# Patient Record
Sex: Female | Born: 1960 | Race: Black or African American | Hispanic: No | Marital: Married | Smoking: Former smoker
Health system: Southern US, Community
[De-identification: ages and names within clinical notes are randomized; demographics above are authoritative.]

## PROBLEM LIST (undated history)

## (undated) DIAGNOSIS — R011 Cardiac murmur, unspecified: Secondary | ICD-10-CM

## (undated) DIAGNOSIS — R9430 Abnormal result of cardiovascular function study, unspecified: Secondary | ICD-10-CM

## (undated) DIAGNOSIS — Z5189 Encounter for other specified aftercare: Secondary | ICD-10-CM

## (undated) DIAGNOSIS — IMO0002 Reserved for concepts with insufficient information to code with codable children: Secondary | ICD-10-CM

## (undated) DIAGNOSIS — T7840XA Allergy, unspecified, initial encounter: Secondary | ICD-10-CM

## (undated) DIAGNOSIS — D573 Sickle-cell trait: Secondary | ICD-10-CM

## (undated) DIAGNOSIS — H269 Unspecified cataract: Secondary | ICD-10-CM

## (undated) HISTORY — PX: TUBAL LIGATION: SHX77

## (undated) HISTORY — DX: Cardiac murmur, unspecified: R01.1

## (undated) HISTORY — DX: Allergy, unspecified, initial encounter: T78.40XA

## (undated) HISTORY — DX: Sickle-cell trait: D57.3

## (undated) HISTORY — DX: Abnormal result of cardiovascular function study, unspecified: R94.30

## (undated) HISTORY — DX: Encounter for other specified aftercare: Z51.89

## (undated) HISTORY — PX: CERCLAGE REMOVAL: SUR1451

## (undated) HISTORY — DX: Unspecified cataract: H26.9

## (undated) HISTORY — DX: Reserved for concepts with insufficient information to code with codable children: IMO0002

---

## 1998-07-09 ENCOUNTER — Emergency Department (HOSPITAL_COMMUNITY): Admission: EM | Admit: 1998-07-09 | Discharge: 1998-07-09 | Payer: Self-pay | Admitting: Emergency Medicine

## 2005-11-21 ENCOUNTER — Encounter: Admission: RE | Admit: 2005-11-21 | Discharge: 2005-11-21 | Payer: Self-pay | Admitting: Family Medicine

## 2005-11-23 ENCOUNTER — Ambulatory Visit: Payer: Self-pay | Admitting: Hematology & Oncology

## 2005-11-24 LAB — CBC & DIFF AND RETIC
Basophils Absolute: 0 10*3/uL (ref 0.0–0.1)
Eosinophils Absolute: 0.1 10*3/uL (ref 0.0–0.5)
HGB: 10.9 g/dL — ABNORMAL LOW (ref 11.6–15.9)
MCHC: 33.9 g/dL (ref 32.0–36.0)
MONO%: 9.5 % (ref 0.0–13.0)
NEUT#: 1.9 10*3/uL (ref 1.5–6.5)
Platelets: 315 10*3/uL (ref 145–400)
RDW: 12.4 % (ref 11.3–14.5)
RETIC #: 72 10*3/uL (ref 19.7–115.1)
WBC: 3.7 10*3/uL — ABNORMAL LOW (ref 3.9–10.0)
lymph#: 1.4 10*3/uL (ref 0.9–3.3)

## 2005-11-24 LAB — CHCC SMEAR

## 2005-11-28 LAB — PROTEIN ELECTROPHORESIS, SERUM
Albumin ELP: 56.8 % (ref 55.8–66.1)
Alpha-1-Globulin: 4.5 % (ref 2.9–4.9)
Alpha-2-Globulin: 8.4 % (ref 7.1–11.8)
Beta 2: 10.9 % — ABNORMAL HIGH (ref 3.2–6.5)
Beta Globulin: 6.6 % (ref 4.7–7.2)
Total Protein, Serum Electrophoresis: 6.3 g/dL (ref 6.0–8.3)

## 2005-11-28 LAB — COMPREHENSIVE METABOLIC PANEL
ALT: 21 U/L (ref 0–40)
CO2: 24 mEq/L (ref 19–32)
Calcium: 8.3 mg/dL — ABNORMAL LOW (ref 8.4–10.5)
Chloride: 107 mEq/L (ref 96–112)
Creatinine, Ser: 0.73 mg/dL (ref 0.40–1.20)
Glucose, Bld: 96 mg/dL (ref 70–99)
Sodium: 140 mEq/L (ref 135–145)
Total Bilirubin: 0.8 mg/dL (ref 0.3–1.2)
Total Protein: 6.3 g/dL (ref 6.0–8.3)

## 2005-11-28 LAB — ANA: Anti Nuclear Antibody(ANA): NEGATIVE

## 2005-11-28 LAB — FERRITIN: Ferritin: 8 ng/mL — ABNORMAL LOW (ref 10–291)

## 2005-11-28 LAB — HEMOGLOBINOPATHY EVALUATION: Hgb A2 Quant: 3.2 % (ref 2.2–3.2)

## 2005-11-28 LAB — VITAMIN B12: Vitamin B-12: 521 pg/mL (ref 211–911)

## 2005-11-29 ENCOUNTER — Observation Stay (HOSPITAL_COMMUNITY): Admission: EM | Admit: 2005-11-29 | Discharge: 2005-11-30 | Payer: Self-pay | Admitting: Emergency Medicine

## 2005-11-30 ENCOUNTER — Encounter (INDEPENDENT_AMBULATORY_CARE_PROVIDER_SITE_OTHER): Payer: Self-pay | Admitting: Cardiology

## 2005-12-06 ENCOUNTER — Ambulatory Visit (HOSPITAL_COMMUNITY): Admission: RE | Admit: 2005-12-06 | Discharge: 2005-12-06 | Payer: Self-pay | Admitting: Hematology & Oncology

## 2006-01-23 ENCOUNTER — Ambulatory Visit: Payer: Self-pay | Admitting: Hematology & Oncology

## 2007-02-14 ENCOUNTER — Encounter: Admission: RE | Admit: 2007-02-14 | Discharge: 2007-02-14 | Payer: Self-pay | Admitting: Family Medicine

## 2008-07-02 IMAGING — CT CT UROGRAM
2 series · 16 of 42 positions shown, 19 images · non-contrast
Comparison: NONE

CLINICAL DATA: Right-sided pain.  Hematuria. 

CT UROGRAM
TECHNIQUE: Thin-section unenhanced axial images were obtained to 
provide a CT urogram to evaluate for possible urinary tract stone. 
 Scan thicknesses were 3 mm with 3-mm increments.

[Series 2: wo · axial · 0.57mm/px · z∈[+16,+352]mm · 13 of 125 slices shown, 16 images]
[im 9/125  soft-tissue]
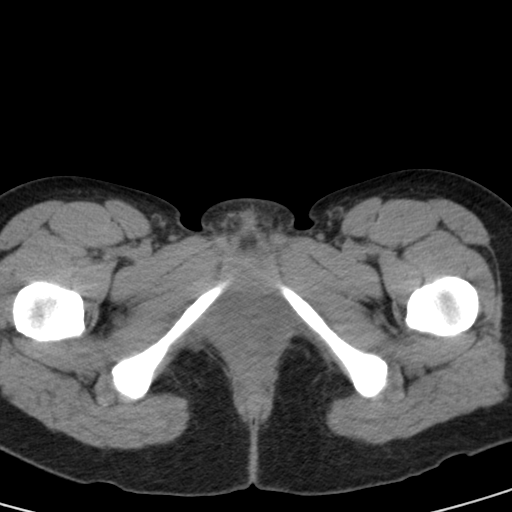
[im 9/125  bone]
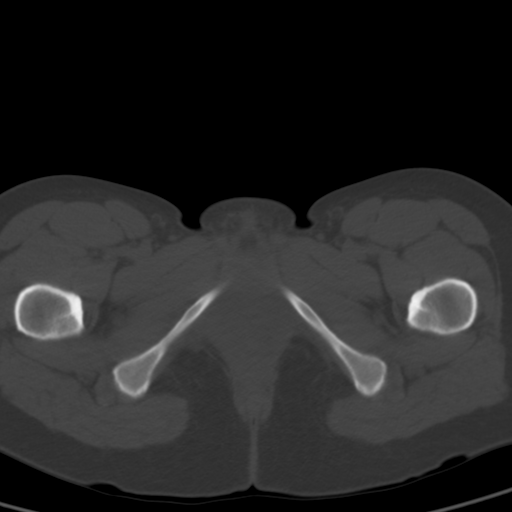
[im 21/125  soft-tissue]
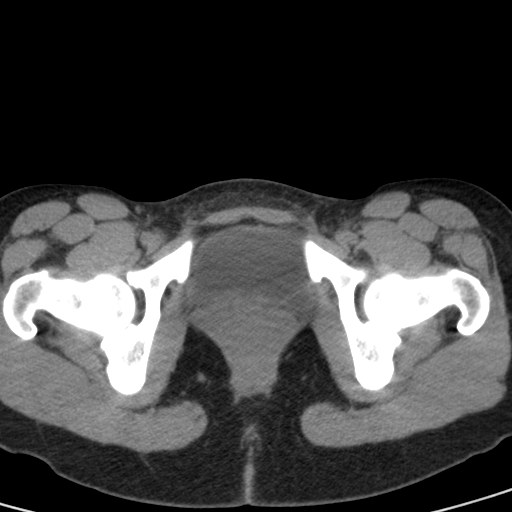
[im 33/125  soft-tissue]
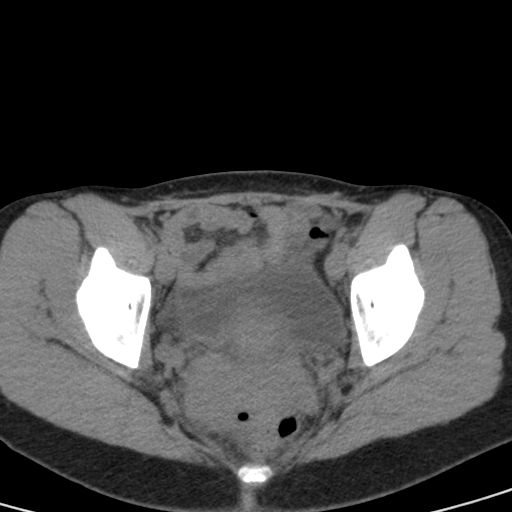
[im 45/125  soft-tissue]
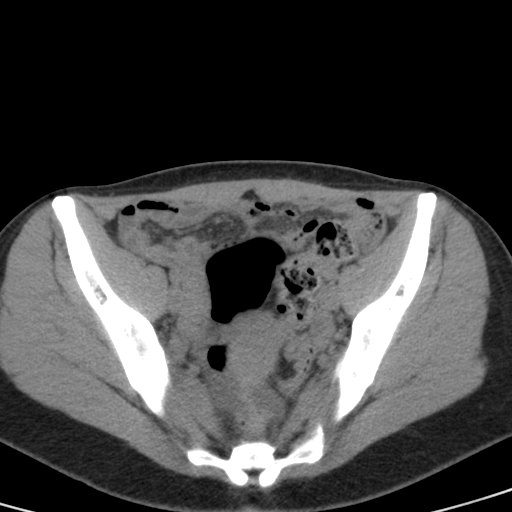
[im 57/125  soft-tissue]
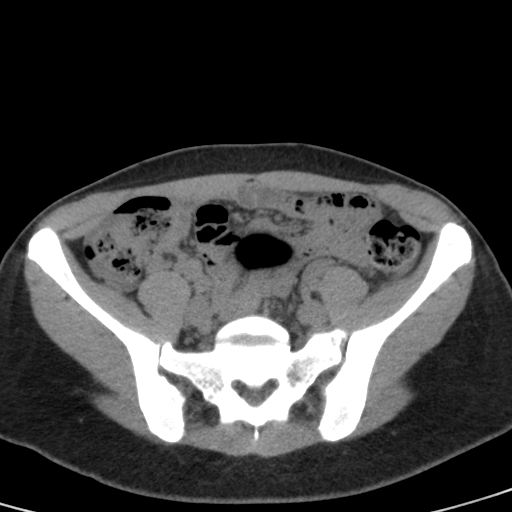
[im 69/125  soft-tissue]
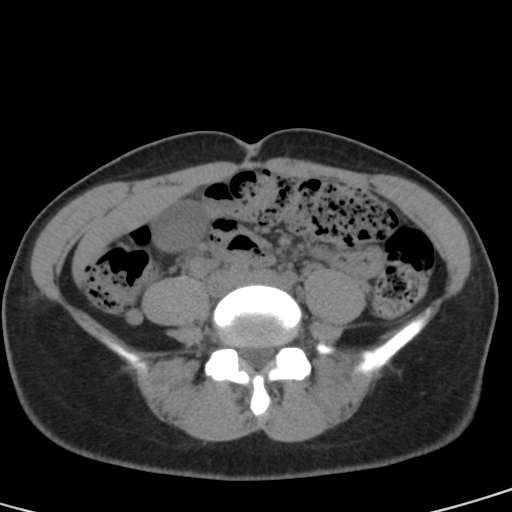
[im 81/125  soft-tissue]
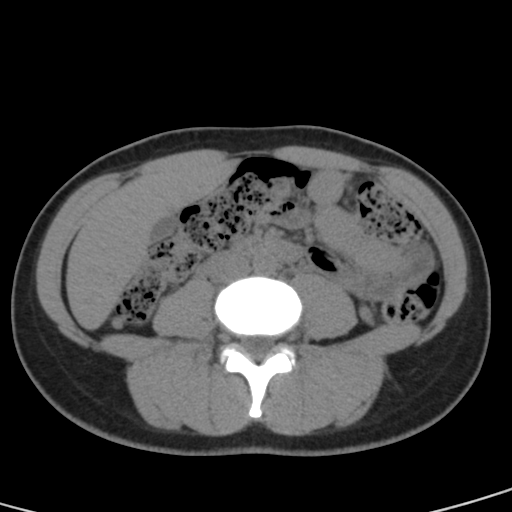
[im 93/125  soft-tissue]
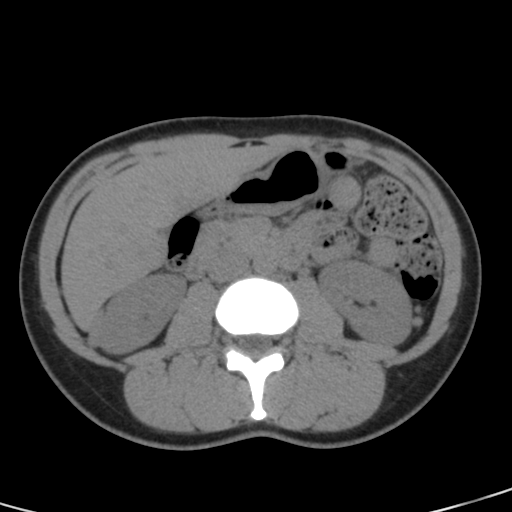
[im 105/125  soft-tissue]
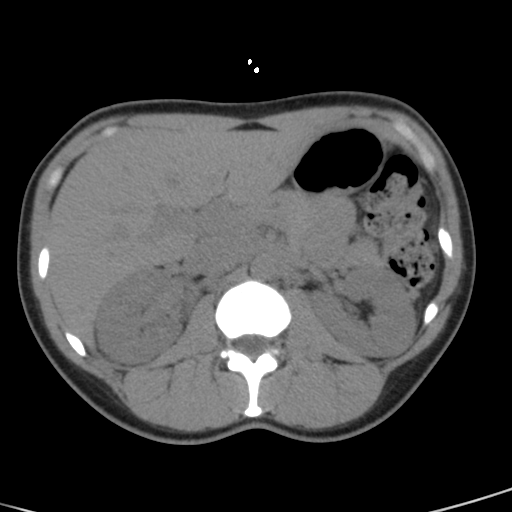
[im 105/125  bone]
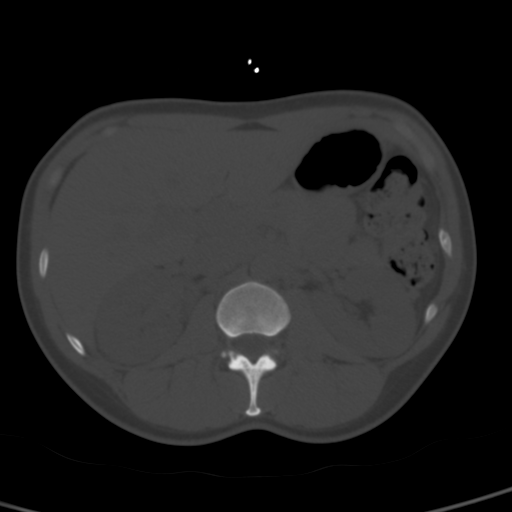
[im 109/125  lung]
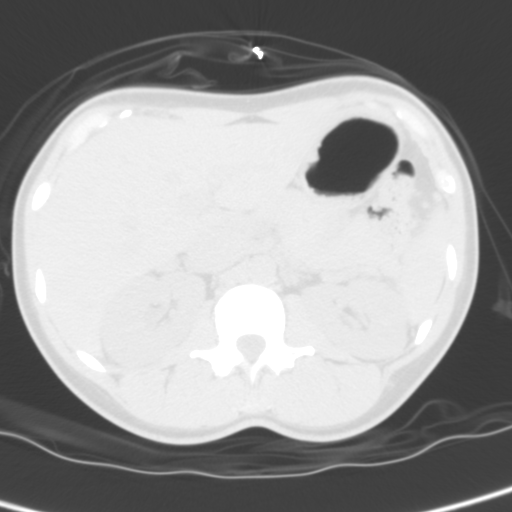
[im 113/125  lung]
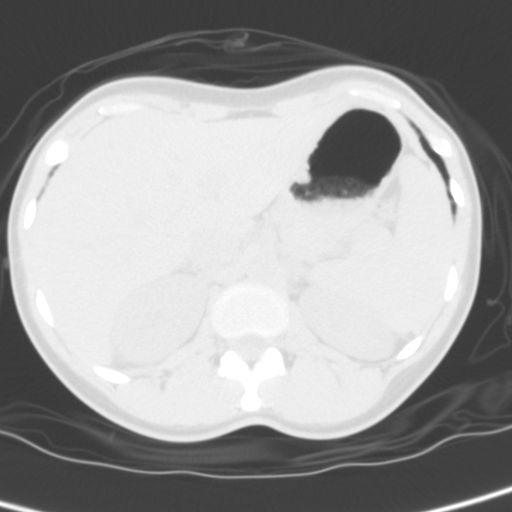
[im 117/125  soft-tissue]
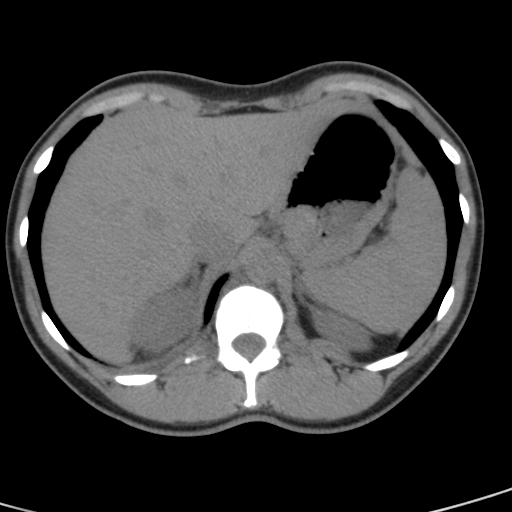
[im 117/125  lung]
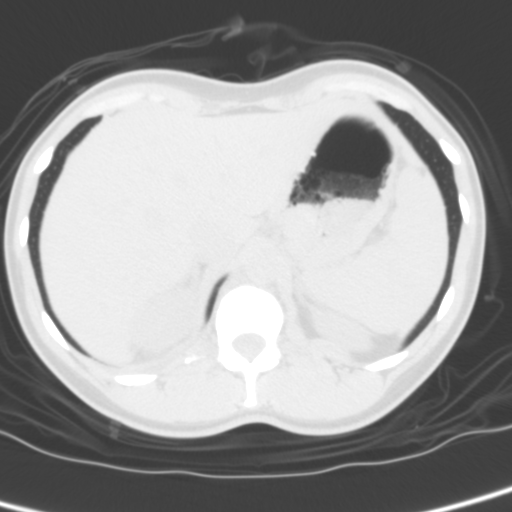
[im 121/125  lung]
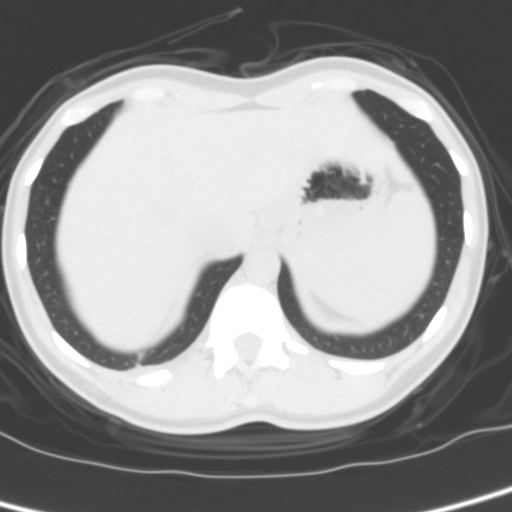

[coronals · coronal · 0.72mm/px · 3 of 72 slices shown]
[im 24/72  soft-tissue]
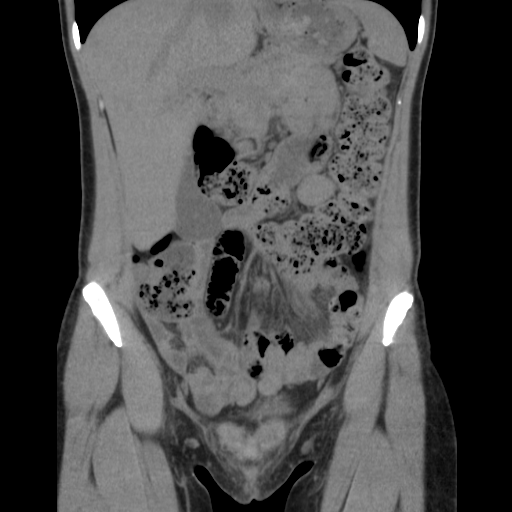
[im 32/72  soft-tissue]
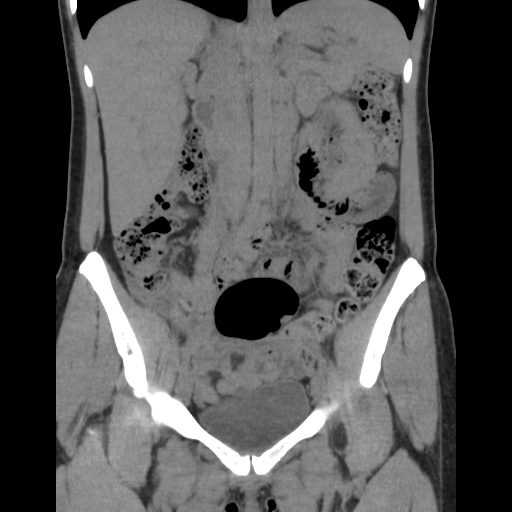
[im 40/72  soft-tissue]
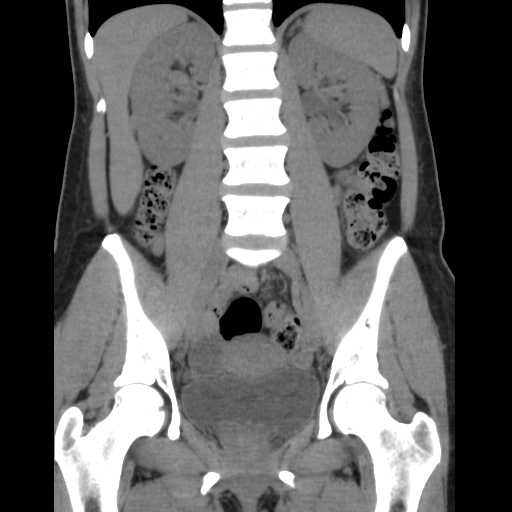

[16 of 42 positions shown; findings below may reference images not displayed]

FINDINGS: Comparison is made to 11-18-2005. No gallstones or renal 
calculi.  Liver, pancreas, and spleen are unremarkable.  The aorta 
is unremarkable. There is free fluid in the cul-de-sac.  There is 
a right ovarian cyst, likely simple-appearing with a maximum 
diameter of 2.8 cm.  There is a small left ovarian cyst 
approximately 1.0 cm in size. Free fluid in the cul-de-sac may 
indicate recent rupture of a cyst. No ureteral calculi.  No 
evidence of renal or ureteral obstruction. No evidence of 
appendicitis, diverticulitis, hernia, or bowel obstruction. No 
lung base mass, infiltrate, edema, or effusion. No lytic or 
blastic lesions.   No substantive change since the prior study 
except for free fluid in the cul-de-sac and ovarian cysts.
IMPRESSION: Bilateral ovarian cysts, larger on the right.  Free 
fluid in the cul-de-sac is either physiologic or related to recent 
rupture of a cyst.  No renal or ureteral calculi or evidence of 
renal obstruction. Blume, Adolfo Miguel electronically reviewed 
on 04/02/2007 Dict Date: 04/02/2007  Tran Date:  04/02/2007 DAS  
JLM

## 2010-07-10 ENCOUNTER — Encounter: Payer: Self-pay | Admitting: Hematology & Oncology

## 2010-11-04 NOTE — Discharge Summary (Signed)
[name], Sabrina Roman                ACCOUNT NO.:  [id number]   MEDICAL RECORD NO.:  [id number]          PATIENT TYPE:  INP   LOCATION:  3729                         FACILITY:  [facility]   PHYSICIAN:  Meade Maw, M.D.    DATE OF BIRTH:  1960-10-19   DATE OF ADMISSION:  11/29/2005  DATE OF DISCHARGE:  11/30/2005                                 DISCHARGE SUMMARY   DISCHARGE DIAGNOSES:  Chest pain, felt to be noncardiac in nature.   Sabrina Roman is a 50 year old female who evaluated by Dr. Fraser Din on November 28, 2005, in the office.  Her symptoms, at that time for chest pain, included  typical, as well as atypical features.  She says the pain has been ongoing  and chronic since the initial part of the year, but has increased in  severity and frequency over the past two weeks prior to admission.  The  patient was admitted on November 29, 2005 with worsening of symptoms.   During the patient's hospital stay, lab work showed normal cardiac  isoenzymes.  The patient underwent a stress Cardiolite that was essentially  normal.  There was a small defect within the inferior wall that was felt to  be artifact.  LV systolic function was normal without any wall motion  abnormalities.  A 2-D echo was also performed and the LV systolic function  was normal.  EF was around 60% with no wall motion abnormalities.  Aortic  valve thickness was mildly to moderately increased.   The patient was discharged home in stable condition.  She may continue her  vitamins daily.  She is to follow up with primary physician in two weeks.  A  renal low-fat diet.  Call for any further problems, questions or concerns.      Guy Franco, P.A.      Meade Maw, M.D.     LB/MEDQ  D:  12/18/2005  T:  12/18/2005  Job:  [id number]

## 2011-11-09 ENCOUNTER — Emergency Department (INDEPENDENT_AMBULATORY_CARE_PROVIDER_SITE_OTHER)
Admission: EM | Admit: 2011-11-09 | Discharge: 2011-11-09 | Disposition: A | Discharge status: Home / Self Care | Payer: Self-pay | Source: Home / Self Care | Attending: Family Medicine | Admitting: Family Medicine

## 2011-11-09 ENCOUNTER — Encounter (HOSPITAL_COMMUNITY): Payer: Self-pay | Admitting: Emergency Medicine

## 2011-11-09 DIAGNOSIS — N390 Urinary tract infection, site not specified: Secondary | ICD-10-CM

## 2011-11-09 LAB — POCT URINALYSIS DIP (DEVICE)
Bilirubin Urine: NEGATIVE
Glucose, UA: NEGATIVE mg/dL
Protein, ur: NEGATIVE mg/dL

## 2011-11-09 MED ORDER — CEPHALEXIN 500 MG PO CAPS: 500.0000 mg | ORAL_CAPSULE | Freq: Four times a day (QID) | ORAL | Status: AC

## 2011-11-09 NOTE — ED Provider Notes (Signed)
History     CSN: [id number]  Arrival date & time 11/09/11  4098   First MD Initiated Contact with Patient 11/09/11 1839      Chief Complaint  Patient presents with  . Back Pain    (Consider location/radiation/quality/duration/timing/severity/associated sxs/prior treatment) Patient is a 51 y.o. female presenting with back pain. The history is provided by the patient.  Back Pain  This is a new problem. The current episode started more than 2 days ago. The problem has been gradually worsening. The pain is associated with no known injury. The pain does not radiate. The pain is mild. Pertinent negatives include no chest pain, no fever, no weight loss, no bladder incontinence, no dysuria, no paresthesias, no paresis, no tingling and no weakness. She has tried NSAIDs for the symptoms. The treatment provided mild relief. Risk factors include menopause.    History reviewed. No pertinent past medical history.  History reviewed. No pertinent past surgical history.  History reviewed. No pertinent family history.  History  Substance Use Topics  . Smoking status: Not on file  . Smokeless tobacco: Not on file  . Alcohol Use: Not on file    OB History    Grav Para Term Preterm Abortions TAB SAB Ect Mult Living                  Review of Systems  Constitutional: Negative.  Negative for fever and weight loss.  Cardiovascular: Negative for chest pain.  Gastrointestinal: Negative for nausea, vomiting and diarrhea.  Genitourinary: Negative for bladder incontinence, dysuria, urgency, frequency and menstrual problem.  Musculoskeletal: Positive for back pain.  Neurological: Negative for tingling, weakness and paresthesias.    Allergies  Review of patient's allergies indicates no known allergies.  Home Medications   Current Outpatient Rx  Name Route Sig Dispense Refill  . CEPHALEXIN 500 MG PO CAPS Oral Take 1 capsule (500 mg total) by mouth 4 (four) times daily. Take all of medicine and  drink lots of fluids 20 capsule 0    BP 149/93  Pulse 67  Temp(Src) 98.6 F (37 C) (Oral)  Resp 16  SpO2 99%  Physical Exam  Nursing note and vitals reviewed. Constitutional: She appears well-developed and well-nourished.  Abdominal: Soft. Bowel sounds are normal. She exhibits no distension and no mass. There is no hepatosplenomegaly. There is tenderness in the suprapubic area. There is no rigidity, no rebound, no guarding and no CVA tenderness.  Skin: Skin is warm and dry.    ED Course  Procedures (including critical care time)  Labs Reviewed  POCT URINALYSIS DIP (DEVICE) - Abnormal; Notable for the following:    Hgb urine dipstick TRACE (*)    Leukocytes, UA TRACE (*) Biochemical Testing Only. Please order routine urinalysis from main lab if confirmatory testing is needed.   All other components within normal limits   No results found.   1. UTI (lower urinary tract infection)       MDM  U/a abnl        Linna Hoff, MD 11/09/11 [phone]

## 2011-11-09 NOTE — ED Notes (Signed)
Pt having lower back, flank, and pelvic pain for several days that is worse with movement. Pt tried heat, ice, and ibuprofen with minimal improvement. Pt denies dysuria or frequency.

## 2011-11-09 NOTE — Discharge Instructions (Signed)
Take all of medicine as directed, drink lots of fluids, see your doctor or return if further problems. °

## 2012-04-24 ENCOUNTER — Emergency Department (INDEPENDENT_AMBULATORY_CARE_PROVIDER_SITE_OTHER)
Admission: EM | Admit: 2012-04-24 | Discharge: 2012-04-24 | Disposition: A | Discharge status: Home / Self Care | Payer: Self-pay | Source: Home / Self Care

## 2012-04-24 ENCOUNTER — Encounter (HOSPITAL_COMMUNITY): Payer: Self-pay

## 2012-04-24 DIAGNOSIS — N390 Urinary tract infection, site not specified: Secondary | ICD-10-CM

## 2012-04-24 LAB — POCT URINALYSIS DIP (DEVICE)
Ketones, ur: NEGATIVE mg/dL
pH: 7 (ref 5.0–8.0)

## 2012-04-24 MED ORDER — CEPHALEXIN 500 MG PO CAPS: 500.0000 mg | ORAL_CAPSULE | Freq: Four times a day (QID) | ORAL | Status: DC

## 2012-04-24 NOTE — ED Notes (Signed)
C/o constant annoying pain in her flank for past 3 days, some improved w motrin; hematuria Sunday

## 2012-04-24 NOTE — ED Provider Notes (Signed)
Medical screening examination/treatment/procedure(s) were performed by non-physician practitioner and as supervising physician I was immediately available for consultation/collaboration.  Leslee Home, M.D.   Reuben Likes, MD 04/24/12 2216

## 2012-04-24 NOTE — ED Provider Notes (Signed)
History     CSN: [id number]  Arrival date & time 04/24/12  1535   None     Chief Complaint  Patient presents with  . Flank Pain    (Consider location/radiation/quality/duration/timing/severity/associated sxs/prior treatment) Patient is a 51 y.o. female presenting with flank pain. The history is provided by the patient. No language interpreter was used.  Flank Pain This is a new problem. The current episode started more than 2 days ago. The problem occurs constantly. The problem has been gradually worsening. She has tried nothing for the symptoms.  Pt complains of a fever 2 days ago.  History reviewed. No pertinent past medical history.  History reviewed. No pertinent past surgical history.  History reviewed. No pertinent family history.  History  Substance Use Topics  . Smoking status: Never Smoker   . Smokeless tobacco: Not on file  . Alcohol Use: No    OB History    Grav Para Term Preterm Abortions TAB SAB Ect Mult Living                  Review of Systems  Genitourinary: Positive for flank pain.  All other systems reviewed and are negative.    Allergies  Review of patient's allergies indicates no known allergies.  Home Medications  No current outpatient prescriptions on file.  BP 138/90  Pulse 74  Temp 98.6 F (37 C) (Oral)  Resp 18  SpO2 100%  Physical Exam  Nursing note and vitals reviewed. Constitutional: She appears well-developed and well-nourished.  HENT:  Head: Normocephalic and atraumatic.  Eyes: Conjunctivae normal and EOM are normal. Pupils are equal, round, and reactive to light.  Neck: Normal range of motion.  Cardiovascular: Normal rate and normal heart sounds.   Pulmonary/Chest: Effort normal and breath sounds normal.  Abdominal: Soft. Bowel sounds are normal.  Neurological: She is alert.  Skin: Skin is warm.    ED Course  Procedures (including critical care time)  Labs Reviewed  POCT URINALYSIS DIP (DEVICE) - Abnormal;  Notable for the following:    Hgb urine dipstick MODERATE (*)     Protein, ur 100 (*)     Leukocytes, UA LARGE (*)  Biochemical Testing Only. Please order routine urinalysis from main lab if confirmatory testing is needed.   All other components within normal limits   No results found.   No diagnosis found.    MDM  Pt given rx for keflex and urine culture ordered        Elson Areas, [place] 04/24/12 1191

## 2012-04-26 LAB — URINE CULTURE: Special Requests: NORMAL

## 2012-04-30 NOTE — ED Notes (Signed)
Urine culture: >100,000 colonies E. Coli. Pt. adequately treated with Keflex. Sabrina Roman M 04/30/2012  

## 2014-06-13 ENCOUNTER — Encounter (HOSPITAL_BASED_OUTPATIENT_CLINIC_OR_DEPARTMENT_OTHER): Payer: Self-pay | Admitting: Emergency Medicine

## 2014-06-13 ENCOUNTER — Emergency Department (HOSPITAL_BASED_OUTPATIENT_CLINIC_OR_DEPARTMENT_OTHER)
Admission: EM | Admit: 2014-06-13 | Discharge: 2014-06-13 | Disposition: A | Discharge status: Home / Self Care | Payer: Medicaid Other | Attending: Emergency Medicine | Admitting: Emergency Medicine

## 2014-06-13 ENCOUNTER — Emergency Department (HOSPITAL_BASED_OUTPATIENT_CLINIC_OR_DEPARTMENT_OTHER): Payer: Medicaid Other

## 2014-06-13 DIAGNOSIS — R079 Chest pain, unspecified: Secondary | ICD-10-CM | POA: Diagnosis present

## 2014-06-13 DIAGNOSIS — R0789 Other chest pain: Secondary | ICD-10-CM | POA: Diagnosis not present

## 2014-06-13 DIAGNOSIS — R5383 Other fatigue: Secondary | ICD-10-CM | POA: Insufficient documentation

## 2014-06-13 DIAGNOSIS — R52 Pain, unspecified: Secondary | ICD-10-CM

## 2014-06-13 DIAGNOSIS — M549 Dorsalgia, unspecified: Secondary | ICD-10-CM | POA: Diagnosis not present

## 2014-06-13 DIAGNOSIS — Z792 Long term (current) use of antibiotics: Secondary | ICD-10-CM | POA: Insufficient documentation

## 2014-06-13 DIAGNOSIS — M542 Cervicalgia: Secondary | ICD-10-CM | POA: Insufficient documentation

## 2014-06-13 LAB — BASIC METABOLIC PANEL
Anion gap: 6 (ref 5–15)
BUN: 14 mg/dL (ref 6–23)
CALCIUM: 9 mg/dL (ref 8.4–10.5)
CO2: 27 mmol/L (ref 19–32)
Chloride: 107 mEq/L (ref 96–112)
Creatinine, Ser: 0.74 mg/dL (ref 0.50–1.10)
GFR calc Af Amer: >90 mL/min (ref >90)
GFR calc non Af Amer: >90 mL/min (ref >90)
GLUCOSE: 96 mg/dL (ref 70–99)
Potassium: 3.9 mmol/L (ref 3.5–5.1)
Sodium: 140 mmol/L (ref 135–145)

## 2014-06-13 LAB — CBC
HCT: 35.9 % — ABNORMAL LOW (ref 36.0–46.0)
Hemoglobin: 12.3 g/dL (ref 12.0–15.0)
MCH: 30.9 pg (ref 26.0–34.0)
MCHC: 34.3 g/dL (ref 30.0–36.0)
MCV: 90.2 fL (ref 78.0–100.0)
Platelets: 188 10*3/uL (ref 150–400)
RBC: 3.98 MIL/uL (ref 3.87–5.11)
RDW: 12.9 % (ref 11.5–15.5)
WBC: 3.1 10*3/uL — ABNORMAL LOW (ref 4.0–10.5)

## 2014-06-13 LAB — TROPONIN I: Troponin I: <0.03 ng/mL (ref <0.031)

## 2014-06-13 MED ORDER — KETOROLAC TROMETHAMINE 30 MG/ML IJ SOLN
30.0000 mg | Freq: Once | INTRAMUSCULAR | Status: AC
  Administered 2014-06-13: 30 mg via INTRAVENOUS
  Filled 2014-06-13: qty 1

## 2014-06-13 NOTE — ED Provider Notes (Signed)
CSN: [id number]     Arrival date & time 06/13/14  1623 History  This chart was scribed for Geoffery Lyonsouglas Hazleigh Mccleave, MD by Modena JanskyAlbert Thayil, ED Scribe. This patient was seen in room MH08/MH08 and the patient's care was started at 6:05 PM.   Chief Complaint  Patient presents with  . Chest Pain  . Shortness of Breath  . Neck Pain   Patient is a 53 y.o. female presenting with chest pain, shortness of breath, and neck pain. The history is provided by the patient. No language interpreter was used.  Chest Pain Pain radiates to:  Does not radiate Pain severity:  Moderate Onset quality:  Gradual Duration:  3 months Progression:  Worsening Chronicity:  New Relieved by:  None tried Worsened by:  Exertion Ineffective treatments:  None tried Associated symptoms: back pain, fatigue, palpitations and shortness of breath   Associated symptoms: no cough, no fever and not vomiting   Shortness of Breath Associated symptoms: chest pain and neck pain   Associated symptoms: no cough, no fever and no vomiting   Neck Pain Associated symptoms: chest pain   Associated symptoms: no fever    HPI Comments: Sabrina Saversicole C Roman is a 53 y.o. female who presents to the Emergency Department complaining of constant moderate chest pain that has been going on for a few months. She reports that she has been having worsening pain, along with SOB, neck pain, back pain, heart palpitations, and fatigue. She states that yesterday she had an episode of pain yesterday when she was going for a short walk. She reports that doing activities exacerbates the chest pain. She denies any diarrhea, emesis, blood in stool, cough, or fever.   History reviewed. No pertinent past medical history. History reviewed. No pertinent past surgical history. No family history on file. History  Substance Use Topics  . Smoking status: Never Smoker   . Smokeless tobacco: Not on file  . Alcohol Use: No   OB History    No data available     Review of Systems   Constitutional: Positive for fatigue. Negative for fever.  Respiratory: Positive for shortness of breath. Negative for cough.   Cardiovascular: Positive for chest pain and palpitations.  Gastrointestinal: Negative for vomiting, diarrhea and anal bleeding.  Musculoskeletal: Positive for back pain and neck pain.  All other systems reviewed and are negative.   Allergies  Review of patient's allergies indicates no known allergies.  Home Medications   Prior to Admission medications   Medication Sig Start Date End Date Taking? Authorizing Provider  cephALEXin (KEFLEX) 500 MG capsule Take 1 capsule (500 mg total) by mouth 4 (four) times daily. 04/24/12   Elson AreasLeslie K Sofia, PA-C   BP 143/73 mmHg  Pulse 66  Temp(Src) 98.4 F (36.9 C) (Oral)  Resp 20  Ht 5\' 5"  (1.651 m)  Wt 125 lb (56.7 kg)  BMI 20.80 kg/m2  SpO2 100% Physical Exam  Constitutional: She is oriented to person, place, and time. She appears well-developed and well-nourished. No distress.  HENT:  Head: Normocephalic and atraumatic.  Neck: Neck supple. No tracheal deviation present.  Cardiovascular: Normal rate, regular rhythm and normal heart sounds.  Exam reveals no gallop and no friction rub.   No murmur heard. Pulmonary/Chest: Effort normal and breath sounds normal. No respiratory distress. She has no wheezes. She has no rales.  Abdominal: Soft. Bowel sounds are normal. She exhibits no distension. There is no tenderness. There is no rebound and no guarding.  Musculoskeletal: Normal range  of motion. She exhibits no edema.  Neurological: She is alert and oriented to person, place, and time.  Skin: Skin is warm and dry.  Psychiatric: She has a normal mood and affect. Her behavior is normal.  Nursing note and vitals reviewed.   ED Course  Procedures (including critical care time) DIAGNOSTIC STUDIES: Oxygen Saturation is 100% on RA, normal by my interpretation.    COORDINATION OF CARE: 6:05 PM- Pt advised of plan for  treatment and pt agrees.  Labs Review Labs Reviewed  CBC - Abnormal; Notable for the following:    WBC 3.1 (*)    HCT 35.9 (*)    All other components within normal limits  BASIC METABOLIC PANEL  TROPONIN I    Imaging Review Dg Chest 2 View  06/13/2014   CLINICAL DATA:  53 year old female with generalized chest pain, increases with movement. Initial encounter.  EXAM: CHEST  2 VIEW  COMPARISON:  Chest radiographs 11/29/2005.  FINDINGS: Chronic somewhat large lung volumes are stable. Normal cardiac size and mediastinal contours. Visualized tracheal air column is within normal limits. No pneumothorax, pulmonary edema, pleural effusion or confluent pulmonary opacity. No acute osseous abnormality identified.  IMPRESSION: No acute cardiopulmonary abnormality.   Electronically Signed   By: Augusto GambleLee  Hall M.D.   On: 06/13/2014 17:08     EKG Interpretation   Date/Time:  Saturday June 13 2014 16:28:27 EST Ventricular Rate:  77 PR Interval:  182 QRS Duration: 80 QT Interval:  370 QTC Calculation: 418 R Axis:   68 Text Interpretation:  Normal sinus rhythm Normal ECG Confirmed by DELOS   MD, Brand Siever ([id number]) on 06/13/2014 6:49:11 PM      MDM   Final diagnoses:  Pain    Patient presents with a several month history of intermittent chest discomfort. This occurs with both rest and exertion and are not associated with nausea, diaphoresis, or radiation to her arm or jaw. She has no risk factors and her symptoms are atypical for cardiac pain. Today's workup reveals a normal EKG and negative troponin. I feel as though she would be best served to follow-up with cardiology to discuss an outpatient stress test. She was provided the number the cardiology clinic and advised to call to arrange this appointment.  I personally performed the services described in this documentation, which was scribed in my presence. The recorded information has been reviewed and is accurate.       Geoffery Lyonsouglas Kailee Essman,  MD 06/14/14 [phone]

## 2014-06-13 NOTE — ED Notes (Signed)
Pt presents to ED with complaints of chest pain and shortness of breath that started yesterday while she was walking with her husband.

## 2014-06-13 NOTE — Discharge Instructions (Signed)
Ibuprofen 600 mg every 6 hours as needed for pain.  Follow-up with Cardiology to arrange a stress test. Call on Monday to arrange this appointment. The contact information has been provided in this discharge summary.  Return to the emergency department if your symptoms substantially worsen or change.   Chest Pain (Nonspecific) It is often hard to give a specific diagnosis for the cause of chest pain. There is always a chance that your pain could be related to something serious, such as a heart attack or a blood clot in the lungs. You need to follow up with your health care provider for further evaluation. CAUSES   Heartburn.  Pneumonia or bronchitis.  Anxiety or stress.  Inflammation around your heart (pericarditis) or lung (pleuritis or pleurisy).  A blood clot in the lung.  A collapsed lung (pneumothorax). It can develop suddenly on its own (spontaneous pneumothorax) or from trauma to the chest.  Shingles infection (herpes zoster virus). The chest wall is composed of bones, muscles, and cartilage. Any of these can be the source of the pain.  The bones can be bruised by injury.  The muscles or cartilage can be strained by coughing or overwork.  The cartilage can be affected by inflammation and become sore (costochondritis). DIAGNOSIS  Lab tests or other studies may be needed to find the cause of your pain. Your health care provider may have you take a test called an ambulatory electrocardiogram (ECG). An ECG records your heartbeat patterns over a 24-hour period. You may also have other tests, such as:  Transthoracic echocardiogram (TTE). During echocardiography, sound waves are used to evaluate how blood flows through your heart.  Transesophageal echocardiogram (TEE).  Cardiac monitoring. This allows your health care provider to monitor your heart rate and rhythm in real time.  Holter monitor. This is a portable device that records your heartbeat and can help diagnose heart  arrhythmias. It allows your health care provider to track your heart activity for several days, if needed.  Stress tests by exercise or by giving medicine that makes the heart beat faster. TREATMENT   Treatment depends on what may be causing your chest pain. Treatment may include:  Acid blockers for heartburn.  Anti-inflammatory medicine.  Pain medicine for inflammatory conditions.  Antibiotics if an infection is present.  You may be advised to change lifestyle habits. This includes stopping smoking and avoiding alcohol, caffeine, and chocolate.  You may be advised to keep your head raised (elevated) when sleeping. This reduces the chance of acid going backward from your stomach into your esophagus. Most of the time, nonspecific chest pain will improve within 2-3 days with rest and mild pain medicine.  HOME CARE INSTRUCTIONS   If antibiotics were prescribed, take them as directed. Finish them even if you start to feel better.  For the next few days, avoid physical activities that bring on chest pain. Continue physical activities as directed.  Do not use any tobacco products, including cigarettes, chewing tobacco, or electronic cigarettes.  Avoid drinking alcohol.  Only take medicine as directed by your health care provider.  Follow your health care provider's suggestions for further testing if your chest pain does not go away.  Keep any follow-up appointments you made. If you do not go to an appointment, you could develop lasting (chronic) problems with pain. If there is any problem keeping an appointment, call to reschedule. SEEK MEDICAL CARE IF:   Your chest pain does not go away, even after treatment.  You have  a rash with blisters on your chest.  You have a fever. SEEK IMMEDIATE MEDICAL CARE IF:   You have increased chest pain or pain that spreads to your arm, neck, jaw, back, or abdomen.  You have shortness of breath.  You have an increasing cough, or you cough up  blood.  You have severe back or abdominal pain.  You feel nauseous or vomit.  You have severe weakness.  You faint.  You have chills. This is an emergency. Do not wait to see if the pain will go away. Get medical help at once. Call your local emergency services (911 in [place] Do not drive yourself to the hospital. MAKE SURE YOU:   Understand these instructions.  Will watch your condition.  Will get help right away if you are not doing well or get worse. Document Released: 03/15/2005 Document Revised: 06/10/2013 Document Reviewed: 01/09/2008 [facility] Patient Information 2015 [place], [place]. This information is not intended to replace advice given to you by your health care provider. Make sure you discuss any questions you have with your health care provider.

## 2014-06-26 ENCOUNTER — Encounter: Payer: Self-pay | Admitting: Cardiology

## 2014-06-26 DIAGNOSIS — R0602 Shortness of breath: Secondary | ICD-10-CM | POA: Insufficient documentation

## 2014-06-26 DIAGNOSIS — R079 Chest pain, unspecified: Secondary | ICD-10-CM | POA: Insufficient documentation

## 2014-06-26 DIAGNOSIS — R9430 Abnormal result of cardiovascular function study, unspecified: Secondary | ICD-10-CM | POA: Insufficient documentation

## 2014-06-29 ENCOUNTER — Ambulatory Visit: Payer: Medicaid Other | Admitting: Cardiology

## 2014-07-03 ENCOUNTER — Other Ambulatory Visit (HOSPITAL_COMMUNITY)
Admission: RE | Admit: 2014-07-03 | Discharge: 2014-07-03 | Disposition: A | Discharge status: Home / Self Care | Payer: Medicaid Other | Source: Ambulatory Visit | Attending: Family Medicine | Admitting: Family Medicine

## 2014-07-03 ENCOUNTER — Encounter: Payer: Self-pay | Admitting: Family Medicine

## 2014-07-03 ENCOUNTER — Ambulatory Visit (INDEPENDENT_AMBULATORY_CARE_PROVIDER_SITE_OTHER): Payer: Medicaid Other | Admitting: Family Medicine

## 2014-07-03 ENCOUNTER — Other Ambulatory Visit: Payer: Self-pay | Admitting: Family Medicine

## 2014-07-03 VITALS — BP 115/68 | HR 69 | Temp 98.1°F | Resp 16 | Ht 65.0 in | Wt 130.0 lb

## 2014-07-03 DIAGNOSIS — Z01419 Encounter for gynecological examination (general) (routine) without abnormal findings: Secondary | ICD-10-CM | POA: Insufficient documentation

## 2014-07-03 DIAGNOSIS — Z23 Encounter for immunization: Secondary | ICD-10-CM | POA: Diagnosis not present

## 2014-07-03 DIAGNOSIS — Z0000 Encounter for general adult medical examination without abnormal findings: Secondary | ICD-10-CM

## 2014-07-03 LAB — CBC WITH DIFFERENTIAL/PLATELET
BASOS PCT: 0 % (ref 0–1)
Basophils Absolute: 0 10*3/uL (ref 0.0–0.1)
Eosinophils Absolute: 0.1 10*3/uL (ref 0.0–0.7)
Eosinophils Relative: 2 % (ref 0–5)
HCT: 36.5 % (ref 36.0–46.0)
Hemoglobin: 12.4 g/dL (ref 12.0–15.0)
Lymphocytes Relative: 44 % (ref 12–46)
Lymphs Abs: 1.1 10*3/uL (ref 0.7–4.0)
MCH: 30.3 pg (ref 26.0–34.0)
MCHC: 34 g/dL (ref 30.0–36.0)
MCV: 89.2 fL (ref 78.0–100.0)
MONOS PCT: 9 % (ref 3–12)
MPV: 9 fL (ref 8.6–12.4)
Monocytes Absolute: 0.2 10*3/uL (ref 0.1–1.0)
NEUTROS PCT: 45 % (ref 43–77)
Neutro Abs: 1.2 10*3/uL — ABNORMAL LOW (ref 1.7–7.7)
PLATELETS: 219 10*3/uL (ref 150–400)
RBC: 4.09 MIL/uL (ref 3.87–5.11)
RDW: 13.9 % (ref 11.5–15.5)
WBC: 2.6 10*3/uL — AB (ref 4.0–10.5)

## 2014-07-03 LAB — COMPLETE METABOLIC PANEL WITH GFR
ALBUMIN: 3.8 g/dL (ref 3.5–5.2)
ALK PHOS: 53 U/L (ref 39–117)
ALT: 28 U/L (ref 0–35)
AST: 36 U/L (ref 0–37)
BUN: 10 mg/dL (ref 6–23)
CHLORIDE: 106 meq/L (ref 96–112)
CO2: 26 mEq/L (ref 19–32)
CREATININE: 0.78 mg/dL (ref 0.50–1.10)
Calcium: 8.9 mg/dL (ref 8.4–10.5)
GFR, EST NON AFRICAN AMERICAN: 86 mL/min
GLUCOSE: 75 mg/dL (ref 70–99)
POTASSIUM: 3.8 meq/L (ref 3.5–5.3)
SODIUM: 142 meq/L (ref 135–145)
Total Bilirubin: 0.8 mg/dL (ref 0.2–1.2)
Total Protein: 6.6 g/dL (ref 6.0–8.3)

## 2014-07-03 LAB — LIPID PANEL
Cholesterol: 122 mg/dL (ref 0–200)
HDL: 50 mg/dL (ref >39)
LDL CALC: 62 mg/dL (ref 0–99)
TRIGLYCERIDES: 49 mg/dL (ref <150)
Total CHOL/HDL Ratio: 2.4 Ratio
VLDL: 10 mg/dL (ref 0–40)

## 2014-07-03 LAB — HEMOGLOBIN A1C
Hgb A1c MFr Bld: 5.5 % (ref <5.7)
Mean Plasma Glucose: 111 mg/dL (ref <117)

## 2014-07-03 LAB — IFOBT (OCCULT BLOOD): IFOBT: NEGATIVE

## 2014-07-03 LAB — TSH: TSH: 0.294 u[IU]/mL — ABNORMAL LOW (ref 0.350–4.500)

## 2014-07-03 MED ORDER — ZOSTER VACCINE LIVE 19400 UNT/0.65ML ~~LOC~~ SOLR: 0.6500 mL | Freq: Once | SUBCUTANEOUS | Status: DC

## 2014-07-03 NOTE — Progress Notes (Signed)
ANNUAL PREVENTATIVE VISIT AND CPE  Subjective:  Sabrina Roman is a 54 y.o. female who presents for Annual Wellness Visit and complete physical.  Date of last wellness visit is unknown. She states that she generally feels well.  Today her BP is115/68. She does workout. She denies chest pain, shortness of breath, dizziness.  She is not on cholesterol medication. She maintains that she has not had her cholesterol checked in years.   Names of Other Physician/Practitioners you currently use: Patient has not been followed by a primary provider in years.   Medication Review: Current Outpatient Prescriptions on File Prior to Visit  Medication Sig Dispense Refill  . cephALEXin (KEFLEX) 500 MG capsule Take 1 capsule (500 mg total) by mouth 4 (four) times daily. (Patient not taking: Reported on 07/03/2014) 40 capsule 0   No current facility-administered medications on file prior to visit.    Current Problems (verified) Patient Active Problem List   Diagnosis Date Noted  . Chest pain 06/26/2014  . SOB (shortness of breath) 06/26/2014  . Ejection fraction     Screening Tests Health Maintenance  Topic Date Due  . PAP SMEAR  07/02/1977  . TETANUS/TDAP  07/02/1978  . MAMMOGRAM  07/02/2009  . COLONOSCOPY  07/02/2009  . INFLUENZA VACCINE  09/17/2014 (Originally 01/17/2014)    There is no immunization history for the selected administration types on file for this patient.  Preventative care: Last colonoscopy: Never Last mammogram: 3 years ago Last pap smear/pelvic exam: She cannot remember last pap DEXA: Never  Prior vaccinations: TD or Tdap: Today  Influenza: Refused Pneumococcal: Refused Prevnar13:  Shingles/Zostavax: Today .    Medication List       This list is accurate as of: 07/03/14  2:37 PM.  Always use your most recent med list.               cephALEXin 500 MG capsule  Commonly known as:  KEFLEX  Take 1 capsule (500 mg total) by mouth 4 (four) times daily.         No past surgical history on file. No family history on file.  History reviewed: allergies, current medications, past family history, past medical history, past social history, past surgical history and problem list   Risk Factors: Osteoporosis/FallRisk: Tobacco History  Substance Use Topics  . Smoking status: Never Smoker   . Smokeless tobacco: Not on file  . Alcohol Use: No   She does not smoke.  Patient is not a former smoker. Are there smokers in your home (other than you)? No Alcohol Current alcohol use: none  Caffeine Current caffeine use: denies use  Exercise Current exercise: aerobics, housecleaning, walking and yard work  Nutrition/Diet Current diet: in general, a "healthy" diet    Depression Screen  Q1: Over the past two weeks, have you felt down, depressed or hopeless? No  Q2: Over the past two weeks, have you felt little interest or pleasure in doing things? No  Have you lost interest or pleasure in daily life?No  Do you often feel hopeless? No  Do you cry easily over simple problems? No  Activities of Daily Living In your present state of health, do you have any difficulty performing the following activities?:  Driving? No Managing money? No Feeding yourself? No Getting from bed to chair?No Climbing a flight of stairs? No Preparing food and eating?No Bathing or showering? No Getting dressed:No Getting to the toilet?No Using the toilet:No Moving around from place to place:No In the past  year have you fallen or had a near fall?No  Are you sexually active?  Yes  Do you have more than one partner?  No Vision Difficulties: YU Hearing Difficulties:No  Cognition  Do you feel that you have a problem with memory?No Do you often misplace items? No  Do you feel safe at home? No  Advanced directives Does patient have a Health Care Power of Attorney?No Does patient have a Living Will?No   Objective:     Blood pressure 115/68, pulse 69,  temperature 98.1 F (36.7 C), temperature source Oral, resp. rate 16, height  (1.651 m), weight 130 lb (58.968 kg), SpO2 100 %. Body mass index is 21.63 kg/(m^2).  General appearance: alert, no distress, WD/WN, female Cognitive Testing  Alert? Yes  Normal Appearance?Yes  Oriented to person? Yes  Place? Yes   Time? Yes  Recall of three objects?  Yes  Can perform simple calculations? Yes  Displays appropriate judgment?Yes  Can read the correct time from a watch face?Yes  HEENT: normocephalic, sclerae anicteric, TMs pearly, nares patent, no discharge or erythema, pharynx normal Oral cavity: MMM, no lesions Neck: supple, no lymphadenopathy, no thyromegaly, no masses Heart: RRR, normal S1, S2, no murmurs Lungs: CTA bilaterally, no wheezes, rhonchi, or rales Abdomen: +bs, soft, non tender, non distended, no masses, no hepatomegaly, no splenomegaly Musculoskeletal: nontender, no swelling, no obvious deformity Extremities: no edema, no cyanosis, no clubbing Pulses: 2+ symmetric, upper and lower extremities, normal cap refill Neurological: alert, oriented x 3, CN2-12 intact, strength normal upper extremities and lower extremities, sensation normal throughout, DTRs 2+ throughout, no cerebellar signs, gait normal Vaginal:  Minimal vaginal discharge, decrease vaginal rugae. Rectal tone present.  Breast: Noo lumps, nodules, or discharge Psychiatric: normal affect, behavior normal, pleasant   Assessment:  Patient denies any difficulties at home. No trouble with ADLs, depression or falls. No recent changes to vision or hearing. Is UTD with immunizations. Is UTD with screening. Discussed Advanced Directives, patient agrees to bring [place] copies of documents if can. Encouraged heart healthy diet, exercise as tolerated and adequate sleep. Declines flu shot. Pap smear done today.     Plan:   During the course of the visit the patient was educated and counseled about appropriate screening and  preventive services including:    Pneumococcal vaccine   Influenza vaccine  Td vaccine  Screening electrocardiogram  Bone densitometry screening  Colorectal cancer screening  Diabetes screening  Glaucoma screening  Nutrition counseling   Advanced directives: requested  Screening recommendations, referrals: Vaccinations: Please see documentation below and orders this visit.  Nutrition assessed and recommended  Colonoscopy requested Recommended yearly ophthalmology/optometry visit for glaucoma screening and checkup Recommended yearly dental visit for hygiene and checkup Advanced directives - requested  Conditions/risks identified: BMI: Discussed weight loss, diet, and increase physical activity.  Increase physical activity: AHA recommends 150 minutes of physical activity a week.  Medications reviewed Fall risk: Low   Annual physical exam  - Tdap vaccine greater than or equal to 7yo IM - Varicella-zoster vaccine subcutaneous  - EKG 12-Lead (Normal sinus rhythm on review) - Cytology - PAP [place] - IFOBT POC (occult bld, rslt in office) Negative - Urinalysis, Complete - CBC with Differential - COMPLETE METABOLIC PANEL WITH GFR - Lipid Panel - Vitamin D, 25-hydroxy - TSH -- Ambulatory referral to Gastroenterology - MM DIGITAL SCREENING BILATERAL; Future - Hemoglobin A1c      RTC: Annual Physical Examination 1 year  Massie Maroon, FNP   07/03/2014

## 2014-07-03 NOTE — Patient Instructions (Addendum)
Preventive Care for Adults A healthy lifestyle and preventive care can promote health and wellness. Preventive health guidelines for women include the following key practices.  A routine yearly physical is a good way to check with your health care provider about your health and preventive screening. It is a chance to share any concerns and updates on your health and to receive a thorough exam.  Visit your dentist for a routine exam and preventive care every 6 months. Brush your teeth twice a day and floss once a day. Good oral hygiene prevents tooth decay and gum disease.  The frequency of eye exams is based on your age, health, family medical history, use of contact lenses, and other factors. Follow your health care provider's recommendations for frequency of eye exams.  Eat a healthy diet. Foods like vegetables, fruits, whole grains, low-fat dairy products, and lean protein foods contain the nutrients you need without too many calories. Decrease your intake of foods high in solid fats, added sugars, and salt. Eat the right amount of calories for you.Get information about a proper diet from your health care provider, if necessary.  Regular physical exercise is one of the most important things you can do for your health. Most adults should get at least 150 minutes of moderate-intensity exercise (any activity that increases your heart rate and causes you to sweat) each week. In addition, most adults need muscle-strengthening exercises on 2 or more days a week.  Maintain a healthy weight. The body mass index (BMI) is a screening tool to identify possible weight problems. It provides an estimate of body fat based on height and weight. Your health care provider can find your BMI and can help you achieve or maintain a healthy weight.For adults 20 years and older:  A BMI below 18.5 is considered underweight.  A BMI of 18.5 to 24.9 is normal.  A BMI of 25 to 29.9 is considered overweight.  A BMI of  30 and above is considered obese.  Maintain normal blood lipids and cholesterol levels by exercising and minimizing your intake of saturated fat. Eat a balanced diet with plenty of fruit and vegetables. Blood tests for lipids and cholesterol should begin at age 76 and be repeated every 5 years. If your lipid or cholesterol levels are high, you are over 50, or you are at high risk for heart disease, you may need your cholesterol levels checked more frequently.Ongoing high lipid and cholesterol levels should be treated with medicines if diet and exercise are not working.  If you smoke, find out from your health care provider how to quit. If you do not use tobacco, do not start.  Lung cancer screening is recommended for adults aged 22-80 years who are at high risk for developing lung cancer because of a history of smoking. A yearly low-dose CT scan of the lungs is recommended for people who have at least a 30-pack-year history of smoking and are a current smoker or have quit within the past 15 years. A pack year of smoking is smoking an average of 1 pack of cigarettes a day for 1 year (for example: 1 pack a day for 30 years or 2 packs a day for 15 years). Yearly screening should continue until the smoker has stopped smoking for at least 15 years. Yearly screening should be stopped for people who develop a health problem that would prevent them from having lung cancer treatment.  If you are pregnant, do not drink alcohol. If you are breastfeeding,  be very cautious about drinking alcohol. If you are not pregnant and choose to drink alcohol, do not have more than 1 drink per day. One drink is considered to be 12 ounces (355 mL) of beer, 5 ounces (148 mL) of wine, or 1.5 ounces (44 mL) of liquor.  Avoid use of street drugs. Do not share needles with anyone. Ask for help if you need support or instructions about stopping the use of drugs.  High blood pressure causes heart disease and increases the risk of  stroke. Your blood pressure should be checked at least every 1 to 2 years. Ongoing high blood pressure should be treated with medicines if weight loss and exercise do not work.  If you are 3-86 years old, ask your health care provider if you should take aspirin to prevent strokes.  Diabetes screening involves taking a blood sample to check your fasting blood sugar level. This should be done once every 3 years, after age 67, if you are within normal weight and without risk factors for diabetes. Testing should be considered at a younger age or be carried out more frequently if you are overweight and have at least 1 risk factor for diabetes.  Breast cancer screening is essential preventive care for women. You should practice "breast self-awareness." This means understanding the normal appearance and feel of your breasts and may include breast self-examination. Any changes detected, no matter how small, should be reported to a health care provider. Women in their 8s and 30s should have a clinical breast exam (CBE) by a health care provider as part of a regular health exam every 1 to 3 years. After age 70, women should have a CBE every year. Starting at age 25, women should consider having a mammogram (breast X-ray test) every year. Women who have a family history of breast cancer should talk to their health care provider about genetic screening. Women at a high risk of breast cancer should talk to their health care providers about having an MRI and a mammogram every year.  Breast cancer gene (BRCA)-related cancer risk assessment is recommended for women who have family members with BRCA-related cancers. BRCA-related cancers include breast, ovarian, tubal, and peritoneal cancers. Having family members with these cancers may be associated with an increased risk for harmful changes (mutations) in the breast cancer genes BRCA1 and BRCA2. Results of the assessment will determine the need for genetic counseling and  BRCA1 and BRCA2 testing.  Routine pelvic exams to screen for cancer are no longer recommended for nonpregnant women who are considered low risk for cancer of the pelvic organs (ovaries, uterus, and vagina) and who do not have symptoms. Ask your health care provider if a screening pelvic exam is right for you.  If you have had past treatment for cervical cancer or a condition that could lead to cancer, you need Pap tests and screening for cancer for at least 20 years after your treatment. If Pap tests have been discontinued, your risk factors (such as having a new sexual partner) need to be reassessed to determine if screening should be resumed. Some women have medical problems that increase the chance of getting cervical cancer. In these cases, your health care provider may recommend more frequent screening and Pap tests.  The HPV test is an additional test that may be used for cervical cancer screening. The HPV test looks for the virus that can cause the cell changes on the cervix. The cells collected during the Pap test can be  tested for HPV. The HPV test could be used to screen women aged 30 years and older, and should be used in women of any age who have unclear Pap test results. After the age of 30, women should have HPV testing at the same frequency as a Pap test.  Colorectal cancer can be detected and often prevented. Most routine colorectal cancer screening begins at the age of 50 years and continues through age 75 years. However, your health care provider may recommend screening at an earlier age if you have risk factors for colon cancer. On a yearly basis, your health care provider may provide home test kits to check for hidden blood in the stool. Use of a small camera at the end of a tube, to directly examine the colon (sigmoidoscopy or colonoscopy), can detect the earliest forms of colorectal cancer. Talk to your health care provider about this at age 50, when routine screening begins. Direct  exam of the colon should be repeated every 5-10 years through age 75 years, unless early forms of pre-cancerous polyps or small growths are found.  People who are at an increased risk for hepatitis B should be screened for this virus. You are considered at high risk for hepatitis B if:  You were born in a country where hepatitis B occurs often. Talk with your health care provider about which countries are considered high risk.  Your parents were born in a high-risk country and you have not received a shot to protect against hepatitis B (hepatitis B vaccine).  You have HIV or AIDS.  You use needles to inject street drugs.  You live with, or have sex with, someone who has hepatitis B.  You get hemodialysis treatment.  You take certain medicines for conditions like cancer, organ transplantation, and autoimmune conditions.  Hepatitis C blood testing is recommended for all people born from 1945 through 1965 and any individual with known risks for hepatitis C.  Practice safe sex. Use condoms and avoid high-risk sexual practices to reduce the spread of sexually transmitted infections (STIs). STIs include gonorrhea, chlamydia, syphilis, trichomonas, herpes, HPV, and human immunodeficiency virus (HIV). Herpes, HIV, and HPV are viral illnesses that have no cure. They can result in disability, cancer, and death.  You should be screened for sexually transmitted illnesses (STIs) including gonorrhea and chlamydia if:  You are sexually active and are younger than 24 years.  You are older than 24 years and your health care provider tells you that you are at risk for this type of infection.  Your sexual activity has changed since you were last screened and you are at an increased risk for chlamydia or gonorrhea. Ask your health care provider if you are at risk.  If you are at risk of being infected with HIV, it is recommended that you take a prescription medicine daily to prevent HIV infection. This is  called preexposure prophylaxis (PrEP). You are considered at risk if:  You are a heterosexual woman, are sexually active, and are at increased risk for HIV infection.  You take drugs by injection.  You are sexually active with a partner who has HIV.  Talk with your health care provider about whether you are at high risk of being infected with HIV. If you choose to begin PrEP, you should first be tested for HIV. You should then be tested every 3 months for as long as you are taking PrEP.  Osteoporosis is a disease in which the bones lose minerals and strength   with aging. This can result in serious bone fractures or breaks. The risk of osteoporosis can be identified using a bone density scan. Women ages 65 years and over and women at risk for fractures or osteoporosis should discuss screening with their health care providers. Ask your health care provider whether you should take a calcium supplement or vitamin D to reduce the rate of osteoporosis.  Menopause can be associated with physical symptoms and risks. Hormone replacement therapy is available to decrease symptoms and risks. You should talk to your health care provider about whether hormone replacement therapy is right for you.  Use sunscreen. Apply sunscreen liberally and repeatedly throughout the day. You should seek shade when your shadow is shorter than you. Protect yourself by wearing long sleeves, pants, a wide-brimmed hat, and sunglasses year round, whenever you are outdoors.  Once a month, do a whole body skin exam, using a mirror to look at the skin on your back. Tell your health care provider of new moles, moles that have irregular borders, moles that are larger than a pencil eraser, or moles that have changed in shape or color.  Stay current with required vaccines (immunizations).  Influenza vaccine. All adults should be immunized every year.  Tetanus, diphtheria, and acellular pertussis (Td, Tdap) vaccine. Pregnant women should  receive 1 dose of Tdap vaccine during each pregnancy. The dose should be obtained regardless of the length of time since the last dose. Immunization is preferred during the 27th-36th week of gestation. An adult who has not previously received Tdap or who does not know her vaccine status should receive 1 dose of Tdap. This initial dose should be followed by tetanus and diphtheria toxoids (Td) booster doses every 10 years. Adults with an unknown or incomplete history of completing a 3-dose immunization series with Td-containing vaccines should begin or complete a primary immunization series including a Tdap dose. Adults should receive a Td booster every 10 years.  Varicella vaccine. An adult without evidence of immunity to varicella should receive 2 doses or a second dose if she has previously received 1 dose. Pregnant females who do not have evidence of immunity should receive the first dose after pregnancy. This first dose should be obtained before leaving the health care facility. The second dose should be obtained 4-8 weeks after the first dose.  Human papillomavirus (HPV) vaccine. Females aged 13-26 years who have not received the vaccine previously should obtain the 3-dose series. The vaccine is not recommended for use in pregnant females. However, pregnancy testing is not needed before receiving a dose. If a female is found to be pregnant after receiving a dose, no treatment is needed. In that case, the remaining doses should be delayed until after the pregnancy. Immunization is recommended for any person with an immunocompromised condition through the age of 26 years if she did not get any or all doses earlier. During the 3-dose series, the second dose should be obtained 4-8 weeks after the first dose. The third dose should be obtained 24 weeks after the first dose and 16 weeks after the second dose.  Zoster vaccine. One dose is recommended for adults aged 60 years or older unless certain conditions are  present.  Measles, mumps, and rubella (MMR) vaccine. Adults born before 1957 generally are considered immune to measles and mumps. Adults born in 1957 or later should have 1 or more doses of MMR vaccine unless there is a contraindication to the vaccine or there is laboratory evidence of immunity to   each of the three diseases. A routine second dose of MMR vaccine should be obtained at least 28 days after the first dose for students attending postsecondary schools, health care workers, or international travelers. People who received inactivated measles vaccine or an unknown type of measles vaccine during 1963-1967 should receive 2 doses of MMR vaccine. People who received inactivated mumps vaccine or an unknown type of mumps vaccine before 1979 and are at high risk for mumps infection should consider immunization with 2 doses of MMR vaccine. For females of childbearing age, rubella immunity should be determined. If there is no evidence of immunity, females who are not pregnant should be vaccinated. If there is no evidence of immunity, females who are pregnant should delay immunization until after pregnancy. Unvaccinated health care workers born before 1957 who lack laboratory evidence of measles, mumps, or rubella immunity or laboratory confirmation of disease should consider measles and mumps immunization with 2 doses of MMR vaccine or rubella immunization with 1 dose of MMR vaccine.  Pneumococcal 13-valent conjugate (PCV13) vaccine. When indicated, a person who is uncertain of her immunization history and has no record of immunization should receive the PCV13 vaccine. An adult aged 19 years or older who has certain medical conditions and has not been previously immunized should receive 1 dose of PCV13 vaccine. This PCV13 should be followed with a dose of pneumococcal polysaccharide (PPSV23) vaccine. The PPSV23 vaccine dose should be obtained at least 8 weeks after the dose of PCV13 vaccine. An adult aged 19  years or older who has certain medical conditions and previously received 1 or more doses of PPSV23 vaccine should receive 1 dose of PCV13. The PCV13 vaccine dose should be obtained 1 or more years after the last PPSV23 vaccine dose.  Pneumococcal polysaccharide (PPSV23) vaccine. When PCV13 is also indicated, PCV13 should be obtained first. All adults aged 65 years and older should be immunized. An adult younger than age 65 years who has certain medical conditions should be immunized. Any person who resides in a nursing home or long-term care facility should be immunized. An adult smoker should be immunized. People with an immunocompromised condition and certain other conditions should receive both PCV13 and PPSV23 vaccines. People with human immunodeficiency virus (HIV) infection should be immunized as soon as possible after diagnosis. Immunization during chemotherapy or radiation therapy should be avoided. Routine use of PPSV23 vaccine is not recommended for American Indians, [place] Natives, or people younger than 65 years unless there are medical conditions that require PPSV23 vaccine. When indicated, people who have unknown immunization and have no record of immunization should receive PPSV23 vaccine. One-time revaccination 5 years after the first dose of PPSV23 is recommended for people aged 19-64 years who have chronic kidney failure, nephrotic syndrome, asplenia, or immunocompromised conditions. People who received 1-2 doses of PPSV23 before age 65 years should receive another dose of PPSV23 vaccine at age 65 years or later if at least 5 years have passed since the previous dose. Doses of PPSV23 are not needed for people immunized with PPSV23 at or after age 65 years.  Meningococcal vaccine. Adults with asplenia or persistent complement component deficiencies should receive 2 doses of quadrivalent meningococcal conjugate (MenACWY-D) vaccine. The doses should be obtained at least 2 months apart.  Microbiologists working with certain meningococcal bacteria, military recruits, people at risk during an outbreak, and people who travel to or live in countries with a high rate of meningitis should be immunized. A first-year college student up through age   21 years who is living in a residence hall should receive a dose if she did not receive a dose on or after her 16th birthday. Adults who have certain high-risk conditions should receive one or more doses of vaccine.  Hepatitis A vaccine. Adults who wish to be protected from this disease, have certain high-risk conditions, work with hepatitis A-infected animals, work in hepatitis A research labs, or travel to or work in countries with a high rate of hepatitis A should be immunized. Adults who were previously unvaccinated and who anticipate close contact with an international adoptee during the first 60 days after arrival in the [place] from a country with a high rate of hepatitis A should be immunized.  Hepatitis B vaccine. Adults who wish to be protected from this disease, have certain high-risk conditions, may be exposed to blood or other infectious body fluids, are household contacts or sex partners of hepatitis B positive people, are clients or workers in certain care facilities, or travel to or work in countries with a high rate of hepatitis B should be immunized.  Haemophilus influenzae type b (Hib) vaccine. A previously unvaccinated person with asplenia or sickle cell disease or having a scheduled splenectomy should receive 1 dose of Hib vaccine. Regardless of previous immunization, a recipient of a hematopoietic stem cell transplant should receive a 3-dose series 6-12 months after her successful transplant. Hib vaccine is not recommended for adults with HIV infection. Preventive Services / Frequency Ages 64 to 68 years  Blood pressure check.** / Every 1 to 2 years.  Lipid and cholesterol check.** / Every 5 years beginning at age  22.  Clinical breast exam.** / Every 3 years for women in their 88s and 53s.  BRCA-related cancer risk assessment.** / For women who have family members with a BRCA-related cancer (breast, ovarian, tubal, or peritoneal cancers).  Pap test.** / Every 2 years from ages 90 through 51. Every 3 years starting at age 21 through age 56 or 3 with a history of 3 consecutive normal Pap tests.  HPV screening.** / Every 3 years from ages 24 through ages 1 to 46 with a history of 3 consecutive normal Pap tests.  Hepatitis C blood test.** / For any individual with known risks for hepatitis C.  Skin self-exam. / Monthly.  Influenza vaccine. / Every year.  Tetanus, diphtheria, and acellular pertussis (Tdap, Td) vaccine.** / Consult your health care provider. Pregnant women should receive 1 dose of Tdap vaccine during each pregnancy. 1 dose of Td every 10 years.  Varicella vaccine.** / Consult your health care provider. Pregnant females who do not have evidence of immunity should receive the first dose after pregnancy.  HPV vaccine. / 3 doses over 6 months, if 72 and younger. The vaccine is not recommended for use in pregnant females. However, pregnancy testing is not needed before receiving a dose.  Measles, mumps, rubella (MMR) vaccine.** / You need at least 1 dose of MMR if you were born in 1957 or later. You may also need a 2nd dose. For females of childbearing age, rubella immunity should be determined. If there is no evidence of immunity, females who are not pregnant should be vaccinated. If there is no evidence of immunity, females who are pregnant should delay immunization until after pregnancy.  Pneumococcal 13-valent conjugate (PCV13) vaccine.** / Consult your health care provider.  Pneumococcal polysaccharide (PPSV23) vaccine.** / 1 to 2 doses if you smoke cigarettes or if you have certain conditions.  Meningococcal vaccine.** /  1 dose if you are age 19 to 21 years and a first-year college  student living in a residence hall, or have one of several medical conditions, you need to get vaccinated against meningococcal disease. You may also need additional booster doses.  Hepatitis A vaccine.** / Consult your health care provider.  Hepatitis B vaccine.** / Consult your health care provider.  Haemophilus influenzae type b (Hib) vaccine.** / Consult your health care provider. Ages 40 to 64 years  Blood pressure check.** / Every 1 to 2 years.  Lipid and cholesterol check.** / Every 5 years beginning at age 20 years.  Lung cancer screening. / Every year if you are aged 55-80 years and have a 30-pack-year history of smoking and currently smoke or have quit within the past 15 years. Yearly screening is stopped once you have quit smoking for at least 15 years or develop a health problem that would prevent you from having lung cancer treatment.  Clinical breast exam.** / Every year after age 40 years.  BRCA-related cancer risk assessment.** / For women who have family members with a BRCA-related cancer (breast, ovarian, tubal, or peritoneal cancers).  Mammogram.** / Every year beginning at age 40 years and continuing for as long as you are in good health. Consult with your health care provider.  Pap test.** / Every 3 years starting at age 30 years through age 65 or 70 years with a history of 3 consecutive normal Pap tests.  HPV screening.** / Every 3 years from ages 30 years through ages 65 to 70 years with a history of 3 consecutive normal Pap tests.  Fecal occult blood test (FOBT) of stool. / Every year beginning at age 50 years and continuing until age 75 years. You may not need to do this test if you get a colonoscopy every 10 years.  Flexible sigmoidoscopy or colonoscopy.** / Every 5 years for a flexible sigmoidoscopy or every 10 years for a colonoscopy beginning at age 50 years and continuing until age 75 years.  Hepatitis C blood test.** / For all people born from 1945 through  1965 and any individual with known risks for hepatitis C.  Skin self-exam. / Monthly.  Influenza vaccine. / Every year.  Tetanus, diphtheria, and acellular pertussis (Tdap/Td) vaccine.** / Consult your health care provider. Pregnant women should receive 1 dose of Tdap vaccine during each pregnancy. 1 dose of Td every 10 years.  Varicella vaccine.** / Consult your health care provider. Pregnant females who do not have evidence of immunity should receive the first dose after pregnancy.  Zoster vaccine.** / 1 dose for adults aged 60 years or older.  Measles, mumps, rubella (MMR) vaccine.** / You need at least 1 dose of MMR if you were born in 1957 or later. You may also need a 2nd dose. For females of childbearing age, rubella immunity should be determined. If there is no evidence of immunity, females who are not pregnant should be vaccinated. If there is no evidence of immunity, females who are pregnant should delay immunization until after pregnancy.  Pneumococcal 13-valent conjugate (PCV13) vaccine.** / Consult your health care provider.  Pneumococcal polysaccharide (PPSV23) vaccine.** / 1 to 2 doses if you smoke cigarettes or if you have certain conditions.  Meningococcal vaccine.** / Consult your health care provider.  Hepatitis A vaccine.** / Consult your health care provider.  Hepatitis B vaccine.** / Consult your health care provider.  Haemophilus influenzae type b (Hib) vaccine.** / Consult your health care provider. Ages 65   years and over  Blood pressure check.** / Every 1 to 2 years.  Lipid and cholesterol check.** / Every 5 years beginning at age 22 years.  Lung cancer screening. / Every year if you are aged 73-80 years and have a 30-pack-year history of smoking and currently smoke or have quit within the past 15 years. Yearly screening is stopped once you have quit smoking for at least 15 years or develop a health problem that would prevent you from having lung cancer  treatment.  Clinical breast exam.** / Every year after age 4 years.  BRCA-related cancer risk assessment.** / For women who have family members with a BRCA-related cancer (breast, ovarian, tubal, or peritoneal cancers).  Mammogram.** / Every year beginning at age 40 years and continuing for as long as you are in good health. Consult with your health care provider.  Pap test.** / Every 3 years starting at age 9 years through age 34 or 91 years with 3 consecutive normal Pap tests. Testing can be stopped between 65 and 70 years with 3 consecutive normal Pap tests and no abnormal Pap or HPV tests in the past 10 years.  HPV screening.** / Every 3 years from ages 57 years through ages 64 or 45 years with a history of 3 consecutive normal Pap tests. Testing can be stopped between 65 and 70 years with 3 consecutive normal Pap tests and no abnormal Pap or HPV tests in the past 10 years.  Fecal occult blood test (FOBT) of stool. / Every year beginning at age 15 years and continuing until age 17 years. You may not need to do this test if you get a colonoscopy every 10 years.  Flexible sigmoidoscopy or colonoscopy.** / Every 5 years for a flexible sigmoidoscopy or every 10 years for a colonoscopy beginning at age 86 years and continuing until age 71 years.  Hepatitis C blood test.** / For all people born from [birth year] through 1966 and any individual with known risks for hepatitis C.  Osteoporosis screening.** / A one-time screening for women ages 83 years and over and women at risk for fractures or osteoporosis.  Skin self-exam. / Monthly.  Influenza vaccine. / Every year.  Tetanus, diphtheria, and acellular pertussis (Tdap/Td) vaccine.** / 1 dose of Td every 10 years.  Varicella vaccine.** / Consult your health care provider.  Zoster vaccine.** / 1 dose for adults aged 61 years or older.  Pneumococcal 13-valent conjugate (PCV13) vaccine.** / Consult your health care provider.  Pneumococcal  polysaccharide (PPSV23) vaccine.** / 1 dose for all adults aged 28 years and older.  Meningococcal vaccine.** / Consult your health care provider.  Hepatitis A vaccine.** / Consult your health care provider.  Hepatitis B vaccine.** / Consult your health care provider.  Haemophilus influenzae type b (Hib) vaccine.** / Consult your health care provider. ** Family history and personal history of risk and conditions may change your health care provider's recommendations. Document Released: 08/01/2001 Document Revised: 10/20/2013 Document Reviewed: 10/31/2010 [facility] Patient Information 2015 [place], [place]. This information is not intended to replace advice given to you by your health care provider. Make sure you discuss any questions you have with your health care provider.

## 2014-07-04 LAB — URINALYSIS, COMPLETE
Bacteria, UA: NONE SEEN
Bilirubin Urine: NEGATIVE
CASTS: NONE SEEN
CRYSTALS: NONE SEEN
Glucose, UA: NEGATIVE mg/dL
Hgb urine dipstick: NEGATIVE
NITRITE: NEGATIVE
Protein, ur: NEGATIVE mg/dL
SQUAMOUS EPITHELIAL / LPF: NONE SEEN
Specific Gravity, Urine: 1.014 (ref 1.005–1.030)
UROBILINOGEN UA: 0.2 mg/dL (ref 0.0–1.0)
pH: 7.5 (ref 5.0–8.0)

## 2014-07-04 LAB — VITAMIN D 25 HYDROXY (VIT D DEFICIENCY, FRACTURES): Vit D, 25-Hydroxy: 16 ng/mL — ABNORMAL LOW (ref 30–100)

## 2014-07-06 ENCOUNTER — Other Ambulatory Visit: Payer: Self-pay | Admitting: Family Medicine

## 2014-07-06 DIAGNOSIS — Z1231 Encounter for screening mammogram for malignant neoplasm of breast: Secondary | ICD-10-CM

## 2014-07-06 LAB — T3: T3, Total: 66.7 ng/dL — ABNORMAL LOW (ref 80.0–204.0)

## 2014-07-06 LAB — T4: T4 TOTAL: 7.1 ug/dL (ref 4.5–12.0)

## 2014-07-07 LAB — CYTOLOGY - PAP

## 2014-07-08 ENCOUNTER — Telehealth: Payer: Self-pay | Admitting: Family Medicine

## 2014-07-08 DIAGNOSIS — E559 Vitamin D deficiency, unspecified: Secondary | ICD-10-CM

## 2014-07-08 DIAGNOSIS — R7989 Other specified abnormal findings of blood chemistry: Secondary | ICD-10-CM

## 2014-07-08 DIAGNOSIS — R82998 Other abnormal findings in urine: Secondary | ICD-10-CM

## 2014-07-08 MED ORDER — ERGOCALCIFEROL 1.25 MG (50000 UT) PO CAPS: 50000.0000 [IU] | ORAL_CAPSULE | ORAL | Status: DC

## 2014-07-08 NOTE — Telephone Encounter (Signed)
Reviewed previous labs from 07/03/2014. TSH decreased, I will repeat test. Also, patient had increased urine leukocytes. Notified lab to add on urine culture. The states that culture could not be added on to the urine sample. I will collect a clean catch urine for culture. In addition, patient has a vitamin D deficiency. I will start Drisdol [id number] units weekly until vitamin D level is greater than 30. At that point I will transition to a daily vitamin d supplement.   Meds ordered this encounter  Medications  . ergocalciferol (VITAMIN D2) 50000 UNITS capsule    Sig: Take 1 capsule (50,000 Units total) by mouth once a week.    Dispense:  4 capsule    Refill:  3    Order Specific Question:  Supervising Provider    Answer:  Marthann SchillerMATTHEWS, MICHELLE A [[id number]]    Sabrina Roman,Sabrina Roman, Sabrina Roman

## 2014-07-13 ENCOUNTER — Other Ambulatory Visit: Payer: Medicaid Other

## 2014-07-13 DIAGNOSIS — R946 Abnormal results of thyroid function studies: Secondary | ICD-10-CM

## 2014-07-13 DIAGNOSIS — R82998 Other abnormal findings in urine: Secondary | ICD-10-CM

## 2014-07-13 LAB — TSH: TSH: 1.38 u[IU]/mL (ref 0.350–4.500)

## 2014-07-14 ENCOUNTER — Ambulatory Visit
Admission: RE | Admit: 2014-07-14 | Discharge: 2014-07-14 | Disposition: A | Discharge status: Home / Self Care | Payer: Medicaid Other | Source: Ambulatory Visit | Attending: Family Medicine | Admitting: Family Medicine

## 2014-07-14 DIAGNOSIS — Z1231 Encounter for screening mammogram for malignant neoplasm of breast: Secondary | ICD-10-CM

## 2014-07-16 ENCOUNTER — Other Ambulatory Visit: Payer: Self-pay | Admitting: Family Medicine

## 2014-07-16 DIAGNOSIS — N390 Urinary tract infection, site not specified: Secondary | ICD-10-CM

## 2014-07-16 LAB — URINE CULTURE

## 2014-07-16 MED ORDER — CIPROFLOXACIN HCL 250 MG PO TABS: 250.0000 mg | ORAL_TABLET | Freq: Two times a day (BID) | ORAL | Status: AC

## 2014-07-16 NOTE — Progress Notes (Signed)
Reviewed urine culture results, which yielded Citrobacter koseri. Bacteria is sensitive to Ciprofloxacin. I start course for 3 days.  I also recommended that patient increase water intake, wipe from front to back post urination, empty bladder completely following sexual intercourse.   Meds ordered this encounter  Medications  . ciprofloxacin (CIPRO) 250 MG tablet    Sig: Take 1 tablet (250 mg total) by mouth 2 (two) times daily.    Dispense:  6 tablet    Refill:  0    Order Specific Question:  Supervising Provider    Answer:  Marthann SchillerMATTHEWS, MICHELLE A [[id number]]     Massie MaroonHollis,Dellie Piasecki M, FNP

## 2014-08-20 ENCOUNTER — Encounter: Payer: Medicaid Other | Admitting: Internal Medicine

## 2015-02-28 ENCOUNTER — Telehealth: Payer: Self-pay

## 2015-02-28 NOTE — Telephone Encounter (Signed)
Per answering service, patient called early this morning with UTI and lot of discomfort   [phone]

## 2015-03-02 NOTE — Telephone Encounter (Signed)
Patient was seen at another facility is on medication

## 2015-07-01 ENCOUNTER — Encounter: Payer: Medicaid Other | Admitting: Internal Medicine

## 2015-09-13 IMAGING — CR DG CHEST 2V
2 series · 2 of 2 positions shown · non-contrast
Comparison: Chest radiographs 11/29/2005.

CLINICAL DATA: 53-year-old female with generalized chest pain,
increases with movement. Initial encounter.

EXAM:
CHEST  2 VIEW

[w chest pa]
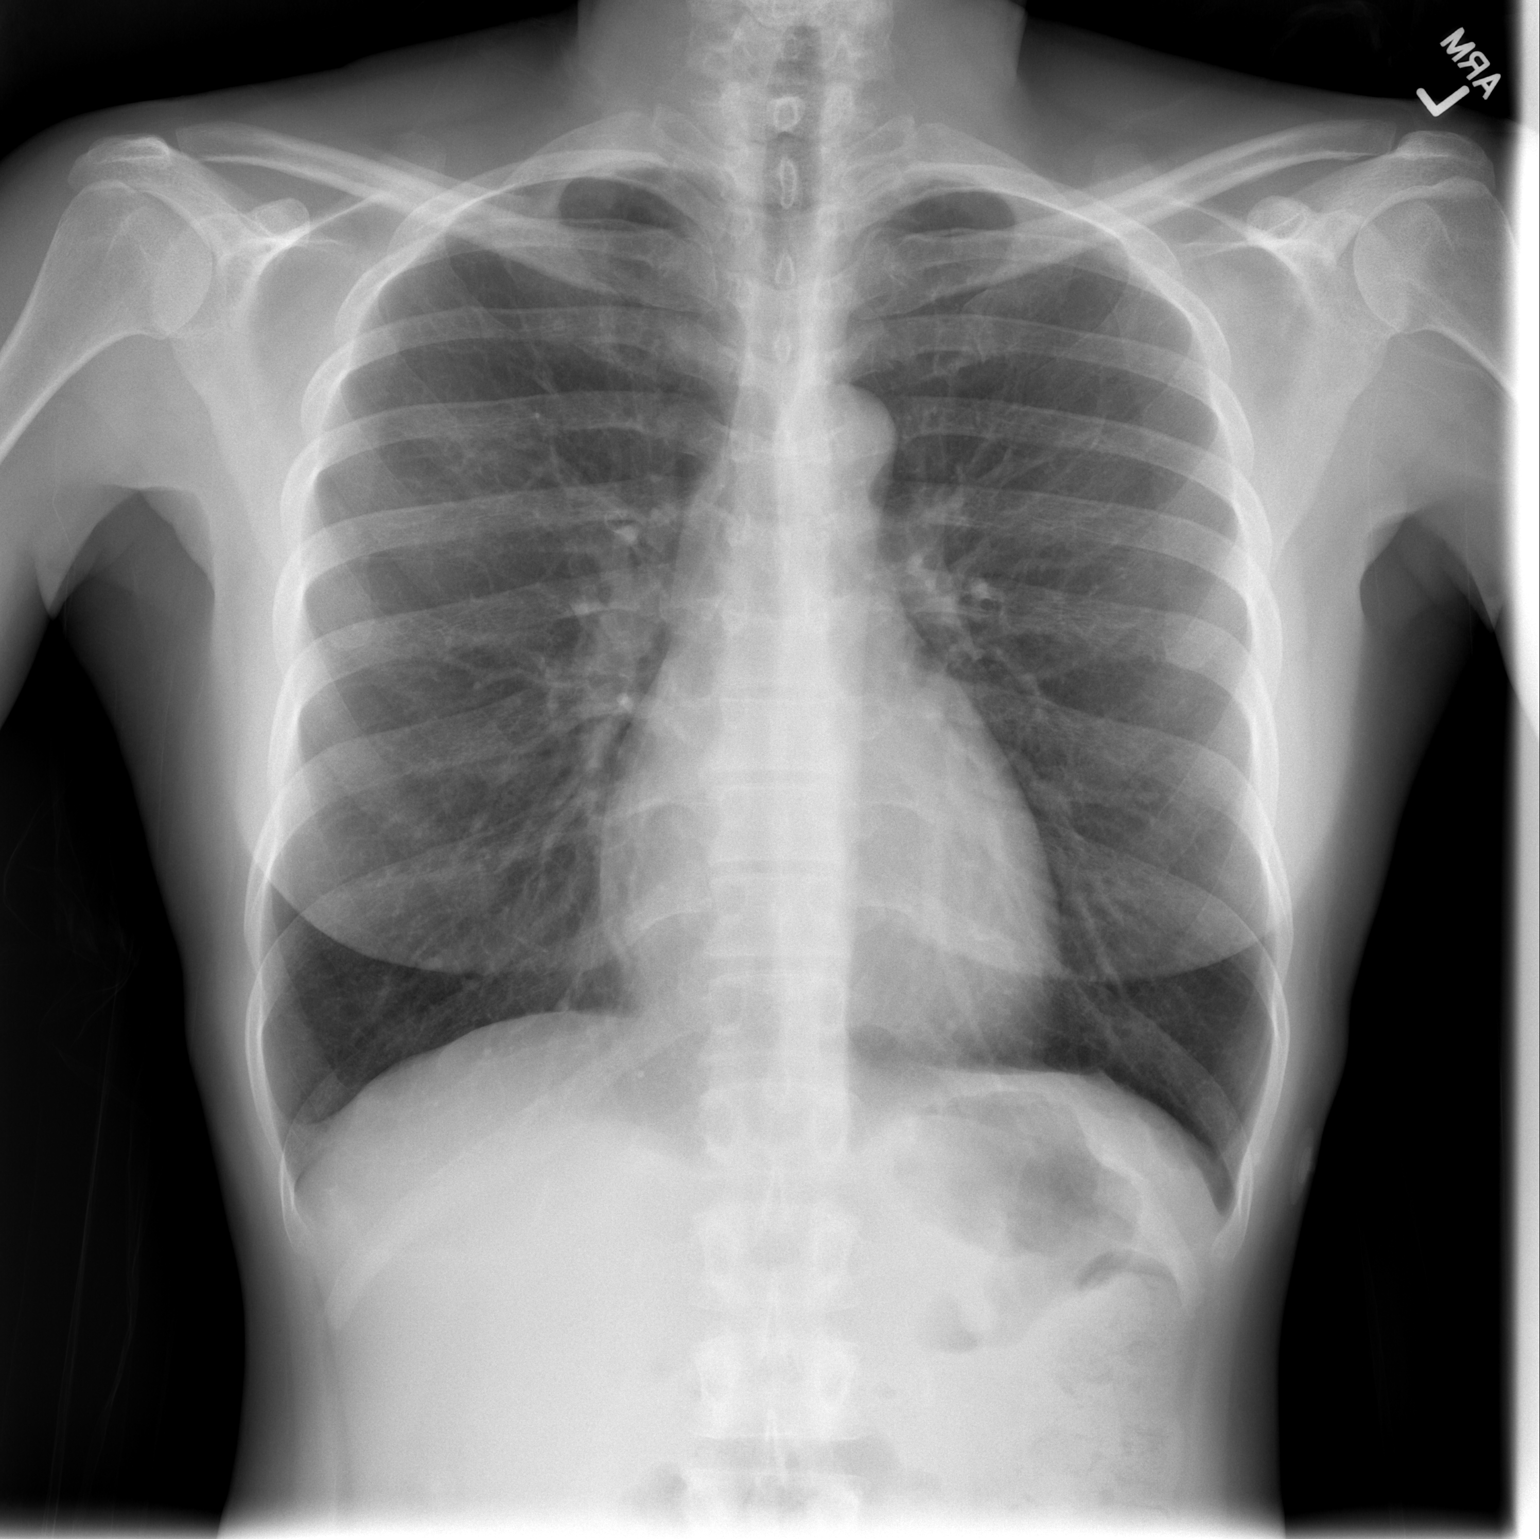

[w chest lat]
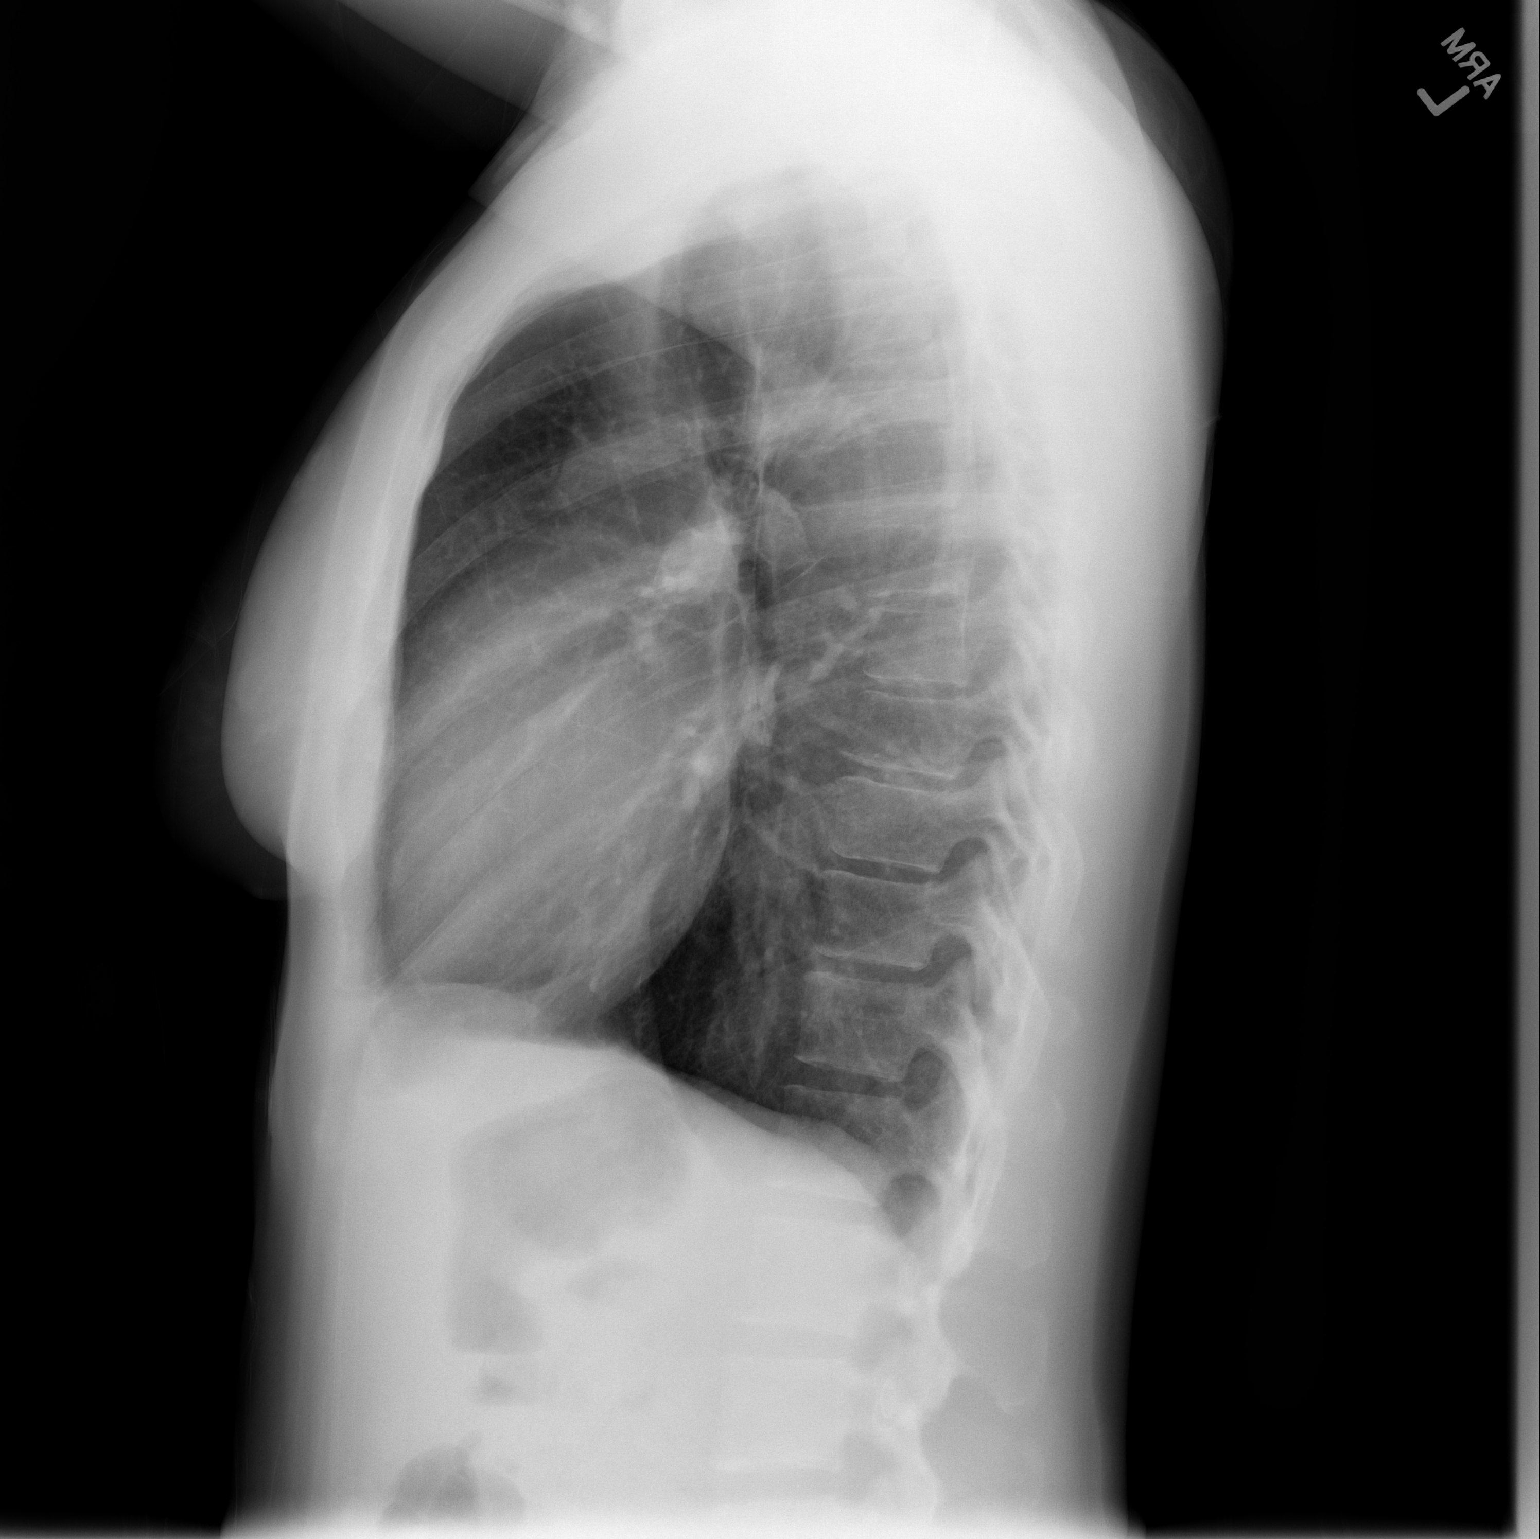

[2 of 2 positions shown; findings below may reference images not displayed]

FINDINGS: Chronic somewhat large lung volumes are stable. Normal cardiac size
and mediastinal contours. Visualized tracheal air column is within
normal limits. No pneumothorax, pulmonary edema, pleural effusion or
confluent pulmonary opacity. No acute osseous abnormality
identified.
IMPRESSION: No acute cardiopulmonary abnormality.

## 2021-09-28 ENCOUNTER — Ambulatory Visit: Payer: Self-pay | Admitting: Family Medicine

## 2021-09-30 ENCOUNTER — Ambulatory Visit: Payer: Self-pay | Admitting: Family Medicine

## 2021-11-01 ENCOUNTER — Encounter: Payer: Self-pay | Admitting: Family Medicine

## 2021-11-01 ENCOUNTER — Ambulatory Visit: Payer: BC Managed Care – PPO | Admitting: Family Medicine

## 2021-11-01 VITALS — BP 130/82 | HR 62 | Temp 97.9°F | Ht 65.0 in | Wt 137.4 lb

## 2021-11-01 DIAGNOSIS — Z0000 Encounter for general adult medical examination without abnormal findings: Secondary | ICD-10-CM | POA: Diagnosis not present

## 2021-11-01 NOTE — Patient Instructions (Signed)
Welcome to [facility] at [facility]! It was a pleasure meeting you today. ° °As discussed, Please schedule a 12 month follow up visit today. ° °PLEASE NOTE: ° °If you had any LAB tests please let us know if you have not heard back within a few days. You may see your results on MyChart before we have a chance to review them but we will give you a call once they are reviewed by us. If we ordered any REFERRALS today, please let us know if you have not heard from their office within the next week.  °Let us know through MyChart if you are needing REFILLS, or have your pharmacy send us the request. You can also use MyChart to communicate with me or any office staff. ° °Please try these tips to maintain a healthy lifestyle: ° °Eat most of your calories during the day when you are active. Eliminate processed foods including packaged sweets (pies, cakes, cookies), reduce intake of potatoes, white bread, white pasta, and white rice. Look for whole grain options, oat flour or almond flour. ° °Each meal should contain half fruits/vegetables, one quarter protein, and one quarter carbs (no bigger than a computer mouse). ° °Cut down on sweet beverages. This includes juice, soda, and sweet tea. Also watch fruit intake, though this is a healthier sweet option, it still contains natural sugar! Limit to 3 servings daily. ° °Drink at least 1 glass of water with each meal and aim for at least 8 glasses per day ° °Exercise at least 150 minutes every week.   °

## 2021-11-01 NOTE — Progress Notes (Signed)
?Phone [phone] ?  ?Subjective:  ? ?Patient is a 61 y.o. female presenting for annual physical.   ?New pt.  New ins and lives in [place] so needing PCP here. ?Pap in March 2022-mamm 2022. Cscope 2019-polyp-repeat 5 yrs.   Does mamm in [place].  Wbc low long time.  Has SS trait ? ? ?Chief Complaint  ?Patient presents with  ? Establish Care  ?  Need new PCP ?Has forms that need to be completed  ? ? ?See problem oriented charting- ?ROS- ROS: ?Gen: no fever, chills  ?Skin: no rash, itching ?ENT: no ear pain, ear drainage, nasal congestion, rhinorrhea, sinus pressure, sore throat ?Eyes: no blurry vision, double vision ?Resp: no cough, wheeze,SOB ?CV: no CP, palpitations, LE edema,   did have palp last yr and saw Card and stress test etc ok. ?GI: no heartburn, n/v/d/c, abd pain ?GU: no dysuria, urgency, frequency, hematuria ?MSK: no joint pain, myalgias, back pain.  Walks a lot.   Dxa last yr-normal ?Neuro: no dizziness, headache, weakness, vertigo ?Psych: no depression, anxiety, insomnia, SI  ? ?The following were reviewed and entered/updated in epic: ?Past Medical History:  ?Diagnosis Date  ? Allergy   ? Blood transfusion without reported diagnosis 02/15/1988  ? Given transfusion after a C-Section  ? Cataract   ? Ejection fraction   ? Heart murmur   ? Diagnosed as a youth into early 30s  ? ?Patient Active Problem List  ? Diagnosis Date Noted  ? Vitamin D deficiency 07/08/2014  ? Decreased thyroid stimulating hormone (TSH) level 07/08/2014  ? Chest pain 06/26/2014  ? SOB (shortness of breath) 06/26/2014  ? Ejection fraction   ? ?Past Surgical History:  ?Procedure Laterality Date  ? CERCLAGE REMOVAL    ? CESAREAN SECTION  02/15/1988  ? complications during delivery  ? TUBAL LIGATION  12/16/1996  ? ? ?Family History  ?Problem Relation Age of Onset  ? Cancer Mother   ? COPD Mother   ? Cancer Sister   ? ? ?Medications- reviewed and updated ?Current Outpatient Medications  ?Medication Sig Dispense Refill  ? Cholecalciferol  (VITAMIN D3) 25 MCG (1000 UT) CAPS Take by mouth.    ? Multiple Vitamin (MULTIVITAMIN) tablet Take 1 tablet by mouth daily.    ? ?No current facility-administered medications for this visit.  ? ? ?Allergies-reviewed and updated ?Allergies  ?Allergen Reactions  ? Ampicillin Hives  ? Penicillins Itching  ?  Other reaction(s): Hives  ? ? ?Social History  ? ?Social History Narrative  ? 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    ? 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 ?Objective  ?Objective:  ?BP 130/82   Pulse 62   Temp 97.9 ?F (36.6 ?C) (Temporal)   Ht 5\' 5"  (1.651 m)   Wt 137 lb 6 oz (62.3 kg)   SpO2 98%   BMI 22.86 kg/m?  ?Physical Exam ?Constitutional:   ?   Appearance: Normal appearance.  ?HENT:  ?   Head: Normocephalic and atraumatic.  ?   Right Ear: Ear canal and external ear normal. There is impacted cerumen.  ?   Left Ear: Tympanic membrane, ear canal and external ear normal.  ?   Nose: Nose normal.  ?   Mouth/Throat:  ?   Mouth: Mucous membranes are moist.  ?   Pharynx: Oropharynx is clear.  ?Eyes:  ?   General: No scleral icterus. ?   Extraocular Movements: Extraocular movements intact.  ?   Conjunctiva/sclera: Conjunctivae normal.  ?   Pupils: Pupils are  equal, round, and reactive to light.  ?Neck:  ?   Vascular: No carotid bruit.  ?Cardiovascular:  ?   Rate and Rhythm: Normal rate and regular rhythm.  ?   Pulses: Normal pulses.  ?   Heart sounds: Normal heart sounds. No murmur heard. ?Pulmonary:  ?   Effort: Pulmonary effort is normal.  ?   Breath sounds: Normal breath sounds.  ?Abdominal:  ?   General: Bowel sounds are normal. There is no distension.  ?   Palpations: Abdomen is soft. There is no mass.  ?   Tenderness: There is no abdominal tenderness.  ?Musculoskeletal:  ?   Cervical back: Neck supple.  ?   Right lower leg: No edema.  ?   Left lower leg: No edema.  ?Lymphadenopathy:  ?   Cervical: No cervical adenopathy.  ?Neurological:  ?   General: No focal deficit present.  ?   Mental Status: She is alert and oriented to person,  place, and time.  ?Psychiatric:     ?   Mood and Affect: Mood normal.  ?  ? Reviewed labs from 09/07/20 and pap in sept.  ?Assessment and Plan  ? ?Health Maintenance counseling: ?1. Anticipatory guidance: Patient counseled regarding regular dental exams q6 months, eye exams,  avoiding smoking and second hand smoke, limiting alcohol to 1 beverage per day, no illicit drugs.  Pt will get imm records ?2. Risk factor reduction:  Advised patient of need for regular exercise and diet rich and fruits and vegetables to reduce risk of heart attack and stroke. Exercise- does.  ?Wt Readings from Last 3 Encounters:  ?11/01/21 137 lb 6 oz (62.3 kg)  ?07/03/14 130 lb (59 kg)  ?06/13/14 125 lb (56.7 kg)  ? ?3. Immunizations/screenings/ancillary studies ?Immunization History  ?Administered Date(s) Administered  ? Moderna Sars-Covid-2 Vaccination 07/09/2019, 08/06/2019  ? Tdap 07/03/2014  ? Zoster, Live 07/03/2014  ? ?Health Maintenance Due  ?Topic Date Due  ? HIV Screening  Never done  ? Hepatitis C Screening  Never done  ? COLONOSCOPY (Pts 45-23yrs Insurance coverage will need to be confirmed)  Never done  ? Zoster Vaccines- Shingrix (1 of 2) Never done  ? MAMMOGRAM  07/14/2016  ? PAP SMEAR-Modifier  07/03/2017  ? COVID-19 Vaccine (3 - Booster for Moderna series) 10/01/2019  ?  ?4. Cervical cancer screening- utd ?5. Breast cancer screening-  mammogram will sch ?6. Colon cancer screening - UTD ?7. Skin cancer screening- advised regular sunscreen use. Denies worrisome, changing, or new skin lesions.  ?8. Birth control/STD check- n/a ?9. Osteoporosis screening- utd ?10. Smoking associated screening - non smoker ? ?Problem List Items Addressed This Visit   ?None ? ? ?Recommended follow up: in about 1 year (around 11/02/2022) for annual. ?Future Appointments  ?Date Time Provider Department Center  ?11/03/2022  2:00 PM Jeani Sow, MD [facility]  ? ? ?Lab/Order associations:n/a fasting ?No diagnosis found. ? ?No orders of  the defined types were placed in this encounter. ? ? ?Angelena Sole, MD ? ? ? ?

## 2021-11-03 ENCOUNTER — Telehealth: Payer: Self-pay | Admitting: Family Medicine

## 2021-11-03 NOTE — Telephone Encounter (Signed)
Pt will call us back with information as to which facility she was previously seen. It is [facility] but we do not know which office to send records request to.

## 2022-03-13 ENCOUNTER — Encounter: Payer: Self-pay | Admitting: *Deleted

## 2022-06-01 ENCOUNTER — Encounter: Payer: Self-pay | Admitting: *Deleted

## 2022-11-03 ENCOUNTER — Encounter: Payer: BC Managed Care – PPO | Admitting: Family Medicine

## 2022-11-14 ENCOUNTER — Ambulatory Visit (INDEPENDENT_AMBULATORY_CARE_PROVIDER_SITE_OTHER): Payer: BC Managed Care – PPO | Admitting: Family Medicine

## 2022-11-14 ENCOUNTER — Encounter: Payer: Self-pay | Admitting: Family Medicine

## 2022-11-14 VITALS — BP 132/80 | HR 70 | Temp 97.9°F | Resp 16 | Ht 65.0 in | Wt 136.2 lb

## 2022-11-14 DIAGNOSIS — Z1159 Encounter for screening for other viral diseases: Secondary | ICD-10-CM | POA: Diagnosis not present

## 2022-11-14 DIAGNOSIS — Z0000 Encounter for general adult medical examination without abnormal findings: Secondary | ICD-10-CM | POA: Diagnosis not present

## 2022-11-14 NOTE — Patient Instructions (Addendum)
It was very nice to see you today!  Send copy of colonoscopy. Take magnesium 200-400 mg/day.    PLEASE NOTE:  If you had any lab tests please let us know if you have not heard back within a few days. You may see your results on MyChart before we have a chance to review them but we will give you a call once they are reviewed by [place]. If we ordered any referrals today, please let us know if you have not heard from their office within the next week.   Please try these tips to maintain a healthy lifestyle:  Eat most of your calories during the day when you are active. Eliminate processed foods including packaged sweets (pies, cakes, cookies), reduce intake of potatoes, white bread, white pasta, and white rice. Look for whole grain options, oat flour or almond flour.  Each meal should contain half fruits/vegetables, one quarter protein, and one quarter carbs (no bigger than a computer mouse).  Cut down on sweet beverages. This includes juice, soda, and sweet tea. Also watch fruit intake, though this is a healthier sweet option, it still contains natural sugar! Limit to 3 servings daily.  Drink at least 1 glass of water with each meal and aim for at least 8 glasses per day  Exercise at least 150 minutes every week.

## 2022-11-14 NOTE — Progress Notes (Signed)
Phone [phone]   Subjective:   Patient is a 62 y.o. female presenting for annual physical.    Chief Complaint  Patient presents with   Annual Exam    CPE Fasting    Annual-pap March 2022.  Colon-due-polyp in past-patient will set up in [place]. Walks some.  Sickle trait  See problem oriented charting- ROS- ROS: Gen: no fever, chills  Skin: no rash, itching ENT: no ear pain, ear drainage, nasal congestion, rhinorrhea, sinus pressure, sore throat Eyes: no blurry vision, double vision Resp: no cough, wheeze,SOB CV: no CP,, LE edema,   +intermitt palpation for many years.  Has seen Card, ER.  Stress test, labs, EKG.  Daily, can last few minutes. No symptoms x occasional lightheaded.  Can walk fine.  Some green tea.   GI: no heartburn, n/v/d/c, abd pain.   Occasional bright red blood per rectum-hemorrhoids. GU: no dysuria, urgency, frequency, hematuria MSK: no joint pain, myalgias, back pain Neuro: no dizziness, weakness, vertigo.   Occasional HA Psych: no depression, anxiety, insomnia, SI   The following were reviewed and entered/updated in epic: Past Medical History:  Diagnosis Date   Allergy    Blood transfusion without reported diagnosis 02/15/1988   Given transfusion after a C-Section   Cataract    Ejection fraction    Heart murmur    Diagnosed as a youth into early 30s   Sickle cell trait (HCC)    Patient Active Problem List   Diagnosis Date Noted   Vitamin D deficiency 07/08/2014   Decreased thyroid stimulating hormone (TSH) level 07/08/2014   Chest pain 06/26/2014   SOB (shortness of breath) 06/26/2014   Ejection fraction    Past Surgical History:  Procedure Laterality Date   CERCLAGE REMOVAL     CESAREAN SECTION  02/15/1988   complications during delivery   TUBAL LIGATION  12/16/1996    Family History  Problem Relation Age of Onset   Cancer Mother    COPD Mother    Cancer Sister     Medications- reviewed and updated Current Outpatient Medications   Medication Sig Dispense Refill   Multiple Vitamin (MULTIVITAMIN) tablet Take 1 tablet by mouth daily.     Cholecalciferol (VITAMIN D3) 25 MCG (1000 UT) CAPS Take by mouth. (Patient not taking: Reported on 11/14/2022)     No current facility-administered medications for this visit.    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 DP 2+B LUNGS: CTAB. No wheezes ABDOMEN:  BS+, soft, NTND, No HSM, no masses EXT:  no edema MSK: no gross abnormalities. MS 5/5 all 4 NEURO: A&O x3.  CN II-XII intact. Occasional fine tremor.  PSYCH: normal mood. Good eye contact     Assessment and Plan  Health Maintenance counseling: 1. Anticipatory guidance: Patient counseled regarding regular dental exams q6 months, eye exams,  avoiding smoking and second hand smoke, limiting alcohol to 1 beverage per day, no illicit drugs.   2. Risk factor reduction:  Advised patient of need for regular exercise and diet rich and fruits and vegetables to reduce risk of heart attack and stroke. Exercise- +.  Wt Readings from Last 3 Encounters:  11/14/22 136 lb 4 oz (61.8 kg)  11/01/21 137 lb 6 oz (62.3 kg)  07/03/14 130 lb (59 kg)   3. Immunizations/screenings/ancillary studies Immunization History  Administered Date(s) Administered   Moderna Sars-Covid-2 Vaccination 07/09/2019,  08/06/2019   Tdap 07/03/2014   Zoster, Live 07/03/2014   Health Maintenance Due  Topic Date Due   HIV Screening  Never done   Hepatitis C Screening  Never done   Colonoscopy  Never done    4. Cervical cancer screening- UTD 5. Breast cancer screening-  mammogram due in july 6. Colon cancer screening - patient will sch 7. Skin cancer screening- advised regular sunscreen use. Denies worrisome, changing, or new skin lesions.  8. Birth control/STD check- n/a 9. Osteoporosis screening- n/a 10. Smoking associated screening - former smoker  Wellness examination -     Lipid panel -     Comprehensive metabolic panel -     CBC with Differential/Platelet -     Hemoglobin A1c -     TSH -     Magnesium -     Hepatitis C antibody -     HIV Antibody (routine testing w rflx)  Screening for viral disease -     Hepatitis C antibody -     HIV Antibody (routine testing w rflx)   Wellness-anticipatory guidance.  Work on Diet/Exercise  Check CBC,CMP,lipids,TSH, A1C.  F/u 1 yr  Palpitaions-magnesium.  Monitor.  Check labs.   Recommended follow up: Return in about 1 year (around 11/14/2023) for annual physical.  Lab/Order associations:+ fasting  Angelena Sole, MD

## 2022-11-15 LAB — COMPREHENSIVE METABOLIC PANEL
ALT: 18 U/L (ref 0–35)
AST: 18 U/L (ref 0–37)
Albumin: 4 g/dL (ref 3.5–5.2)
Alkaline Phosphatase: 61 U/L (ref 39–117)
BUN: 13 mg/dL (ref 6–23)
CO2: 28 mEq/L (ref 19–32)
Calcium: 9.2 mg/dL (ref 8.4–10.5)
Chloride: 107 mEq/L (ref 96–112)
Creatinine, Ser: 0.79 mg/dL (ref 0.40–1.20)
GFR: 79.63 mL/min (ref >60.00)
Glucose, Bld: 80 mg/dL (ref 70–99)
Potassium: 3.8 mEq/L (ref 3.5–5.1)
Sodium: 142 mEq/L (ref 135–145)
Total Bilirubin: 0.9 mg/dL (ref 0.2–1.2)
Total Protein: 6.8 g/dL (ref 6.0–8.3)

## 2022-11-15 LAB — CBC WITH DIFFERENTIAL/PLATELET
Basophils Absolute: 0 10*3/uL (ref 0.0–0.1)
Basophils Relative: 0.9 % (ref 0.0–3.0)
Eosinophils Absolute: 0.1 10*3/uL (ref 0.0–0.7)
Eosinophils Relative: 2.2 % (ref 0.0–5.0)
HCT: 38.5 % (ref 36.0–46.0)
Hemoglobin: 12.7 g/dL (ref 12.0–15.0)
Lymphocytes Relative: 46 % (ref 12.0–46.0)
Lymphs Abs: 1.4 10*3/uL (ref 0.7–4.0)
MCHC: 33.1 g/dL (ref 30.0–36.0)
MCV: 91 fl (ref 78.0–100.0)
Monocytes Absolute: 0.3 10*3/uL (ref 0.1–1.0)
Monocytes Relative: 8.8 % (ref 3.0–12.0)
Neutro Abs: 1.2 10*3/uL — ABNORMAL LOW (ref 1.4–7.7)
Neutrophils Relative %: 42.1 % — ABNORMAL LOW (ref 43.0–77.0)
Platelets: 249 10*3/uL (ref 150.0–400.0)
RBC: 4.23 Mil/uL (ref 3.87–5.11)
RDW: 13.4 % (ref 11.5–15.5)
WBC: 3 10*3/uL — ABNORMAL LOW (ref 4.0–10.5)

## 2022-11-15 LAB — MAGNESIUM: Magnesium: 2 mg/dL (ref 1.5–2.5)

## 2022-11-15 LAB — LIPID PANEL
Cholesterol: 171 mg/dL (ref 0–200)
HDL: 72.6 mg/dL (ref >39.00)
LDL Cholesterol: 84 mg/dL (ref 0–99)
NonHDL: 98.47
Total CHOL/HDL Ratio: 2
Triglycerides: 73 mg/dL (ref 0.0–149.0)
VLDL: 14.6 mg/dL (ref 0.0–40.0)

## 2022-11-15 LAB — HEMOGLOBIN A1C: Hgb A1c MFr Bld: 5.3 % (ref 4.6–6.5)

## 2022-11-15 LAB — HEPATITIS C ANTIBODY: Hepatitis C Ab: NONREACTIVE

## 2022-11-15 LAB — HIV ANTIBODY (ROUTINE TESTING W REFLEX): HIV 1&2 Ab, 4th Generation: NONREACTIVE

## 2022-11-15 LAB — TSH: TSH: 1.31 u[IU]/mL (ref 0.35–5.50)

## 2022-11-15 NOTE — Progress Notes (Signed)
Normal / stable labs.

## 2023-11-16 ENCOUNTER — Encounter: Payer: BC Managed Care – PPO | Admitting: Family Medicine
